# Patient Record
Sex: Male | Born: 1965 | Race: Black or African American | Hispanic: No | Marital: Single | Smoking: Never smoker
Health system: Southern US, Community
[De-identification: ages and names within clinical notes are randomized; demographics above are authoritative.]

## PROBLEM LIST (undated history)

## (undated) DIAGNOSIS — I4891 Unspecified atrial fibrillation: Secondary | ICD-10-CM

## (undated) DIAGNOSIS — I890 Lymphedema, not elsewhere classified: Secondary | ICD-10-CM

## (undated) DIAGNOSIS — R569 Unspecified convulsions: Secondary | ICD-10-CM

## (undated) DIAGNOSIS — I509 Heart failure, unspecified: Secondary | ICD-10-CM

## (undated) DIAGNOSIS — G7111 Myotonic muscular dystrophy: Secondary | ICD-10-CM

## (undated) DIAGNOSIS — E8840 Mitochondrial metabolism disorder, unspecified: Secondary | ICD-10-CM

## (undated) HISTORY — PX: OTHER SURGICAL HISTORY: SHX169

## (undated) HISTORY — PX: CRANIOTOMY: SHX93

---

## 2016-09-02 ENCOUNTER — Emergency Department (HOSPITAL_COMMUNITY): Payer: Medicare Other

## 2016-09-02 ENCOUNTER — Emergency Department (HOSPITAL_COMMUNITY)
Admission: EM | Admit: 2016-09-02 | Discharge: 2016-09-02 | Disposition: A | Discharge status: Home / Self Care | Payer: Medicare Other | Attending: Emergency Medicine | Admitting: Emergency Medicine

## 2016-09-02 ENCOUNTER — Encounter (HOSPITAL_COMMUNITY): Payer: Self-pay | Admitting: *Deleted

## 2016-09-02 DIAGNOSIS — R4182 Altered mental status, unspecified: Secondary | ICD-10-CM | POA: Diagnosis present

## 2016-09-02 DIAGNOSIS — R404 Transient alteration of awareness: Secondary | ICD-10-CM

## 2016-09-02 DIAGNOSIS — N390 Urinary tract infection, site not specified: Secondary | ICD-10-CM | POA: Diagnosis not present

## 2016-09-02 DIAGNOSIS — I5022 Chronic systolic (congestive) heart failure: Secondary | ICD-10-CM | POA: Diagnosis not present

## 2016-09-02 HISTORY — DX: Mitochondrial metabolism disorder, unspecified: E88.40

## 2016-09-02 HISTORY — DX: Myotonic muscular dystrophy: G71.11

## 2016-09-02 HISTORY — DX: Unspecified convulsions: R56.9

## 2016-09-02 HISTORY — DX: Lymphedema, not elsewhere classified: I89.0

## 2016-09-02 HISTORY — DX: Unspecified atrial fibrillation: I48.91

## 2016-09-02 HISTORY — DX: Heart failure, unspecified: I50.9

## 2016-09-02 LAB — CBG MONITORING, ED: GLUCOSE-CAPILLARY: 105 mg/dL — AB (ref 65–99)

## 2016-09-02 LAB — URINALYSIS, ROUTINE W REFLEX MICROSCOPIC
BILIRUBIN URINE: NEGATIVE
GLUCOSE, UA: NEGATIVE mg/dL
Ketones, ur: NEGATIVE mg/dL
NITRITE: NEGATIVE
PH: 5 (ref 5.0–8.0)
Protein, ur: 100 mg/dL — AB
SPECIFIC GRAVITY, URINE: 1.02 (ref 1.005–1.030)

## 2016-09-02 LAB — I-STAT ARTERIAL BLOOD GAS, ED
ACID-BASE EXCESS: 15 mmol/L — AB (ref 0.0–2.0)
Bicarbonate: 38.4 mmol/L — ABNORMAL HIGH (ref 20.0–28.0)
O2 Saturation: 100 %
PH ART: 7.599 — AB (ref 7.350–7.450)
PO2 ART: 165 mmHg — AB (ref 83.0–108.0)
TCO2: 40 mmol/L (ref 0–100)
pCO2 arterial: 38.9 mmHg (ref 32.0–48.0)

## 2016-09-02 LAB — CBC WITH DIFFERENTIAL/PLATELET
BASOS PCT: 0 %
Basophils Absolute: 0 10*3/uL (ref 0.0–0.1)
EOS PCT: 2 %
Eosinophils Absolute: 0.3 10*3/uL (ref 0.0–0.7)
HEMATOCRIT: 24.9 % — AB (ref 39.0–52.0)
HEMOGLOBIN: 7.5 g/dL — AB (ref 13.0–17.0)
Lymphocytes Relative: 16 %
Lymphs Abs: 2.4 10*3/uL (ref 0.7–4.0)
MCH: 24.2 pg — AB (ref 26.0–34.0)
MCHC: 30.1 g/dL (ref 30.0–36.0)
MCV: 80.3 fL (ref 78.0–100.0)
Monocytes Absolute: 2.7 10*3/uL — ABNORMAL HIGH (ref 0.1–1.0)
Monocytes Relative: 18 %
NEUTROS PCT: 64 %
Neutro Abs: 9.5 10*3/uL — ABNORMAL HIGH (ref 1.7–7.7)
PLATELETS: 225 10*3/uL (ref 150–400)
RBC: 3.1 MIL/uL — AB (ref 4.22–5.81)
RDW: 21.2 % — ABNORMAL HIGH (ref 11.5–15.5)
WBC: 14.9 10*3/uL — AB (ref 4.0–10.5)

## 2016-09-02 LAB — DIGOXIN LEVEL: DIGOXIN LVL: 0.3 ng/mL — AB (ref 0.8–2.0)

## 2016-09-02 LAB — BASIC METABOLIC PANEL
Anion gap: 14 (ref 5–15)
BUN: 54 mg/dL — ABNORMAL HIGH (ref 6–20)
CHLORIDE: 88 mmol/L — AB (ref 101–111)
CO2: 30 mmol/L (ref 22–32)
Calcium: 12.6 mg/dL — ABNORMAL HIGH (ref 8.9–10.3)
Creatinine, Ser: 1.03 mg/dL (ref 0.61–1.24)
GFR calc non Af Amer: >60 mL/min (ref >60)
Glucose, Bld: 99 mg/dL (ref 65–99)
Potassium: 3.1 mmol/L — ABNORMAL LOW (ref 3.5–5.1)
SODIUM: 132 mmol/L — AB (ref 135–145)

## 2016-09-02 LAB — AMMONIA: Ammonia: 43 umol/L — ABNORMAL HIGH (ref 9–35)

## 2016-09-02 LAB — TROPONIN I: Troponin I: 0.04 ng/mL (ref <0.03)

## 2016-09-02 MED ORDER — DEXTROSE 5 % IV SOLN
2.0000 g | Freq: Once | INTRAVENOUS | Status: AC
  Administered 2016-09-02: 2 g via INTRAVENOUS
  Filled 2016-09-02: qty 2

## 2016-09-02 NOTE — ED Notes (Signed)
On way to CT, pt beginning to move hands and head and open eyes.

## 2016-09-02 NOTE — Discharge Instructions (Signed)
°  The transient altered mental status, seems to be related to a urinary tract infection.  Your doctor will write prescriptions for continuing treatment following the Rocephin, which was given here today.  A digoxin level has been ordered and is pending.

## 2016-09-02 NOTE — ED Provider Notes (Signed)
[facility]EMERGENCY DEPT Provider Note   CSN: [id number] Arrival date & time: 09/02/16  1234     History   Chief Complaint No chief complaint on file.   HPI Barry [name] is a 51 y.o. male.  He is here from [facility] for evaluation of altered mental status. Exact timing is unknown. Reportedly, he is able to communicate typically by "tapping his fingers". Apparently today he could not do that, so he was sent here for evaluation. He has anoxic encephalopathy, is chronically intubated by tracheostomy, on a ventilator, with reported MRSA infection.   Level V caveat- altered mental status  HPI  Past Medical History:  Diagnosis Date  . A-fib (HCC)   . CHF (congestive heart failure) (HCC)    chronic systolic  . Chronic acquired lymphedema   . Mitochondrial disease (HCC)   . Myotonic muscular dystrophy (HCC)   . Seizures (HCC)     There are no active problems to display for this patient.   No past surgical history on file.     Home Medications    Prior to Admission medications   Not on File    Family History No family history on file.  Social History Social History  Substance Use Topics  . Smoking status: Not on file  . Smokeless tobacco: Not on file  . Alcohol use Not on file     Allergies   Chocolate; Iodine; and Nitroglycerin   Review of Systems Review of Systems  Unable to perform ROS: Mental status change     Physical Exam Updated Vital Signs BP (!) 123/52   Pulse (!) 44   Temp 97.1 F (36.2 C) (Oral)   Resp 16   SpO2 100%   Physical Exam  Constitutional: He appears well-developed. He appears toxic. He has a sickly appearance. He appears distressed.  Obese. Appears older than stated age  HENT:  Head: Normocephalic and atraumatic.  Right Ear: External ear normal.  Left Ear: External ear normal.  Eyes: Pupils are equal, round, and reactive to light.  Conjunctiva. Left eye injected without drainage. Pupils 2-3 mm bilaterally and reactive.  Disconjugate gaze, right  esotropia  Neck: Phonation normal.  Take aspirin site anteriorly appears normal.  Cardiovascular: Regular rhythm and normal heart sounds.   Bradycardic. PICC line site left upper chest wall appears normal.  Pulmonary/Chest: Breath sounds normal. No respiratory distress. He exhibits no bony tenderness.  Normal lung sounds, anterior chest wall. On ventilator.  Abdominal: Soft. He exhibits distension. He exhibits no mass. There is no tenderness. There is no guarding.  Musculoskeletal: He exhibits no deformity.  Neurological:  Flaccid arms and legs  Skin: Skin is warm, dry and intact.  Psychiatric:  Obtunded  Nursing note and vitals reviewed.    ED Treatments / Results  Labs (all labs ordered are listed, but only abnormal results are displayed) Labs Reviewed  BASIC METABOLIC PANEL - Abnormal; Notable for the following:       Result Value   Sodium 132 (*)    Potassium 3.1 (*)    Chloride 88 (*)    BUN 54 (*)    Calcium 12.6 (*)    All other components within normal limits  CBC WITH DIFFERENTIAL/PLATELET - Abnormal; Notable for the following:    WBC 14.9 (*)    RBC 3.10 (*)    Hemoglobin 7.5 (*)    HCT 24.9 (*)    MCH 24.2 (*)    RDW 21.2 (*)    Neutro Abs  9.5 (*)    Monocytes Absolute 2.7 (*)    All other components within normal limits  AMMONIA - Abnormal; Notable for the following:    Ammonia 43 (*)    All other components within normal limits  TROPONIN I - Abnormal; Notable for the following:    Troponin I 0.04 (*)    All other components within normal limits  CBG MONITORING, ED - Abnormal; Notable for the following:    Glucose-Capillary 105 (*)    All other components within normal limits  I-STAT ARTERIAL BLOOD GAS, ED - Abnormal; Notable for the following:    pH, Arterial 7.599 (*)    pO2, Arterial 165.0 (*)    Bicarbonate 38.4 (*)    Acid-Base Excess 15.0 (*)    All other components within normal limits  URINE CULTURE  BLOOD GAS,  ARTERIAL  URINALYSIS, ROUTINE W REFLEX MICROSCOPIC  DIGOXIN LEVEL    EKG  EKG Interpretation  Date/Time:  Friday September 02 2016 13:34:14 EST Ventricular Rate:  54 PR Interval:    QRS Duration: 150 QT Interval:  470 QTC Calculation: 445 R Axis:   98 Text Interpretation:  Atrial fibrillation with slow ventricular response with a competing junctional pacemaker Rightward axis Non-specific intra-ventricular conduction block Abnormal ECG No previous ECGs available Confirmed by Effie ShyWENTZ  MD, Valissa Lyvers [phone]([id number]) on 09/02/2016 2:34:26 PM       Radiology Ct Head Wo Contrast  Result Date: 09/02/2016 CLINICAL DATA:  Altered mental status today.  No recent injury. EXAM: CT HEAD WITHOUT CONTRAST TECHNIQUE: Contiguous axial images were obtained from the base of the skull through the vertex without intravenous contrast. COMPARISON:  None. FINDINGS: Brain: There is mild central and cortical atrophy. Encephalomalacia is identified within the right frontal and temporal lobes. There is no evidence for hemorrhage, mass lesion, or acute infarction. Study quality is degraded by significant patient motion artifact. Numerous cuts are repeated. Vascular: No hyperdense vessel or unexpected calcification. Skull: Status post right craniotomy.  No acute fracture identified. Sinuses/Orbits: There is significant opacity throughout the ethmoid and left sphenoid air cells. Bilateral mastoid effusions are present. Suspect remote fracture of the medial wall of the right orbit. The globes are intact. Other: None IMPRESSION: 1. Atrophy and right frontal and temporal encephalomalacia without evidence for acute intracranial abnormality. 2. Post right craniotomy changes. 3. Paranasal sinus disease. 4. Bilateral mastoid effusions. Electronically Signed   By: Norva PavlovElizabeth  Brown M.D.   On: 09/02/2016 14:40   Dg Chest Port 1 View  Result Date: 09/02/2016 CLINICAL DATA:  Altered mental status and decreased heart rate EXAM: PORTABLE CHEST 1 VIEW  COMPARISON:  None. FINDINGS: Cardiac shadow is enlarged. Central vascular congestion is noted with small bilateral pleural effusions. Tracheostomy tube is noted. Left retrocardiac density is noted likely representing atelectasis/infiltrate. A left jugular central line is noted projecting at the junction of the left subclavian and left jugular vein. No pneumothorax is noted. No bony abnormality is seen. IMPRESSION: Central vascular congestion and bilateral pleural effusions. Left retrocardiac density as described. Electronically Signed   By: Alcide CleverMark  Lukens M.D.   On: 09/02/2016 13:37    Procedures Procedures (including critical care time)  Medications Ordered in ED Medications  cefTRIAXone (ROCEPHIN) 2 g in dextrose 5 % 50 mL IVPB (not administered)     Initial Impression / Assessment and Plan / ED Course  I have reviewed the triage vital signs and the nursing notes.  Pertinent labs & imaging results that were available during my care  of the patient were reviewed by me and considered in my medical decision making (see chart for details).  Clinical Course as of Sep 02 1546  Fri Sep 02, 2016  1456 At this point, the patient has been seen to be alert and is moving both arms. I asked him to tap my hand, if he was feeling good, and he did not move his hand. I asked him to tap my hand, if he was feeling bad and he tapped my hand repetitively.  [EW]  1533 I discussed the case, with his physician at [facility], Dr. Welton Flakes, she requested that a digoxin level be sent. She states that he does not need to stay here for that result to return. She understands that the patient likely has a UTI, and will write orders for antibiotics, following his Rocephin treatment.  [EW]    Clinical Course User Index [EW] Mancel Bale, MD    Medications  cefTRIAXone (ROCEPHIN) 2 g in dextrose 5 % 50 mL IVPB (not administered)    Patient Vitals for the past 24 hrs:  BP Temp Temp src Pulse Resp SpO2  09/02/16 1500  (!) 123/52 - - (!) 44 16 100 %  09/02/16 1445 - - - (!) 51 15 100 %  09/02/16 1430 104/55 - - (!) 50 17 100 %  09/02/16 1415 - - - - 15 -  09/02/16 1400 - - - (!) 47 - 100 %  09/02/16 1345 - - - (!) 46 - 100 %  09/02/16 1330 (!) 96/45 - - (!) 40 - 100 %  09/02/16 1315 - - - (!) 39 18 100 %  09/02/16 1303 (!) 105/52 97.1 F (36.2 C) Oral (!) 44 16 100 %  09/02/16 1251 (!) 105/52 - - (!) 40 18 100 %      Final Clinical Impressions(s) / ED Diagnoses   Final diagnoses:  Urinary tract infection without hematuria, site unspecified  Transient alteration of awareness    Nonspecific altered mental status. Likely cause urinary tract infection. Bradycardia with normal blood pressure. Doubt severe sepsis, metabolic instability or impending vascular collapse.  Nursing Notes Reviewed/ Care Coordinated Applicable Imaging Reviewed Interpretation of Laboratory Data incorporated into ED treatment  The patient appears reasonably screened and/or stabilized for discharge and I doubt any other medical condition or other [facility] requiring further screening, evaluation, or treatment in the ED at this time prior to discharge.  Plan: Home Medications- continue her usual medications, and antibiotics as prescribed by the physician, at the [facility]; Home Treatments- rest; return here if the recommended treatment, does not improve the symptoms; Recommended follow up- PCP, when necessary   New Prescriptions New Prescriptions   No medications on file     Mancel Bale, MD 09/02/16 1549

## 2016-09-02 NOTE — ED Triage Notes (Signed)
Pt here via [facility] from [facility] for AMS. Staff states normally pt is able to tap to communicate, but pt is unresponsive.  Initial stats 83%, rr 16 (vented), bp118/43, cbg 154.  LBB noted by [facility] and not noted in med hx.  Pt unresponsive to painful stimuli upon arrival.

## 2016-09-02 NOTE — ED Notes (Signed)
CRITICAL VALUE ALERT  Critical value received:  0.04 Troponin   Date of notification:  09/02/2016  Time of notification: 1408   Critical value read back: Yes  Nurse who received alert:  Apolonio SchneidersHannah Steele RN  MD notified (1st page):  MD Effie ShyWentz  Time of first page:  1408   MD notified (2nd page):  Time of second page:  Responding MD:  Effie ShyWentz  Time MD responded:  [phone]

## 2016-09-03 LAB — URINE CULTURE: Culture: 60000 — AB

## 2016-09-04 ENCOUNTER — Telehealth: Payer: Self-pay

## 2016-09-04 NOTE — Telephone Encounter (Signed)
[facility] [phone]

## 2016-11-05 ENCOUNTER — Encounter (HOSPITAL_COMMUNITY): Payer: Self-pay

## 2016-11-05 ENCOUNTER — Emergency Department (HOSPITAL_COMMUNITY): Payer: Medicare Other

## 2016-11-05 ENCOUNTER — Inpatient Hospital Stay (HOSPITAL_COMMUNITY)
Admission: EM | Admit: 2016-11-05 | Discharge: 2016-11-13 | DRG: 870 | Disposition: A | Discharge status: Other Acute Inpatient Hospital | Payer: Medicare Other | Attending: Pulmonary Disease | Admitting: Pulmonary Disease

## 2016-11-05 DIAGNOSIS — I890 Lymphedema, not elsewhere classified: Secondary | ICD-10-CM | POA: Diagnosis not present

## 2016-11-05 DIAGNOSIS — Z7952 Long term (current) use of systemic steroids: Secondary | ICD-10-CM

## 2016-11-05 DIAGNOSIS — Z6841 Body Mass Index (BMI) 40.0 and over, adult: Secondary | ICD-10-CM | POA: Diagnosis not present

## 2016-11-05 DIAGNOSIS — J9601 Acute respiratory failure with hypoxia: Secondary | ICD-10-CM

## 2016-11-05 DIAGNOSIS — J9600 Acute respiratory failure, unspecified whether with hypoxia or hypercapnia: Secondary | ICD-10-CM | POA: Diagnosis present

## 2016-11-05 DIAGNOSIS — A419 Sepsis, unspecified organism: Secondary | ICD-10-CM | POA: Diagnosis present

## 2016-11-05 DIAGNOSIS — L8990 Pressure ulcer of unspecified site, unspecified stage: Secondary | ICD-10-CM | POA: Insufficient documentation

## 2016-11-05 DIAGNOSIS — Z930 Tracheostomy status: Secondary | ICD-10-CM

## 2016-11-05 DIAGNOSIS — Z7982 Long term (current) use of aspirin: Secondary | ICD-10-CM

## 2016-11-05 DIAGNOSIS — I959 Hypotension, unspecified: Secondary | ICD-10-CM

## 2016-11-05 DIAGNOSIS — L89152 Pressure ulcer of sacral region, stage 2: Secondary | ICD-10-CM | POA: Diagnosis not present

## 2016-11-05 DIAGNOSIS — J9611 Chronic respiratory failure with hypoxia: Secondary | ICD-10-CM

## 2016-11-05 DIAGNOSIS — K5641 Fecal impaction: Secondary | ICD-10-CM | POA: Diagnosis present

## 2016-11-05 DIAGNOSIS — E1165 Type 2 diabetes mellitus with hyperglycemia: Secondary | ICD-10-CM | POA: Diagnosis present

## 2016-11-05 DIAGNOSIS — Z9911 Dependence on respirator [ventilator] status: Secondary | ICD-10-CM

## 2016-11-05 DIAGNOSIS — K921 Melena: Secondary | ICD-10-CM | POA: Diagnosis present

## 2016-11-05 DIAGNOSIS — Z79899 Other long term (current) drug therapy: Secondary | ICD-10-CM

## 2016-11-05 DIAGNOSIS — E11649 Type 2 diabetes mellitus with hypoglycemia without coma: Secondary | ICD-10-CM | POA: Diagnosis present

## 2016-11-05 DIAGNOSIS — G40909 Epilepsy, unspecified, not intractable, without status epilepticus: Secondary | ICD-10-CM | POA: Diagnosis present

## 2016-11-05 DIAGNOSIS — K922 Gastrointestinal hemorrhage, unspecified: Secondary | ICD-10-CM

## 2016-11-05 DIAGNOSIS — Z91018 Allergy to other foods: Secondary | ICD-10-CM

## 2016-11-05 DIAGNOSIS — E876 Hypokalemia: Secondary | ICD-10-CM | POA: Diagnosis present

## 2016-11-05 DIAGNOSIS — J9622 Acute and chronic respiratory failure with hypercapnia: Secondary | ICD-10-CM | POA: Diagnosis not present

## 2016-11-05 DIAGNOSIS — J151 Pneumonia due to Pseudomonas: Secondary | ICD-10-CM | POA: Diagnosis present

## 2016-11-05 DIAGNOSIS — J9621 Acute and chronic respiratory failure with hypoxia: Secondary | ICD-10-CM | POA: Diagnosis present

## 2016-11-05 DIAGNOSIS — I110 Hypertensive heart disease with heart failure: Secondary | ICD-10-CM | POA: Diagnosis not present

## 2016-11-05 DIAGNOSIS — G7111 Myotonic muscular dystrophy: Secondary | ICD-10-CM | POA: Diagnosis present

## 2016-11-05 DIAGNOSIS — K920 Hematemesis: Secondary | ICD-10-CM | POA: Diagnosis present

## 2016-11-05 DIAGNOSIS — Z515 Encounter for palliative care: Secondary | ICD-10-CM

## 2016-11-05 DIAGNOSIS — Z452 Encounter for adjustment and management of vascular access device: Secondary | ICD-10-CM

## 2016-11-05 DIAGNOSIS — I5043 Acute on chronic combined systolic (congestive) and diastolic (congestive) heart failure: Secondary | ICD-10-CM | POA: Diagnosis not present

## 2016-11-05 DIAGNOSIS — I482 Chronic atrial fibrillation: Secondary | ICD-10-CM | POA: Diagnosis present

## 2016-11-05 DIAGNOSIS — R627 Adult failure to thrive: Secondary | ICD-10-CM

## 2016-11-05 DIAGNOSIS — I429 Cardiomyopathy, unspecified: Secondary | ICD-10-CM | POA: Diagnosis present

## 2016-11-05 DIAGNOSIS — D62 Acute posthemorrhagic anemia: Secondary | ICD-10-CM | POA: Diagnosis not present

## 2016-11-05 DIAGNOSIS — J9690 Respiratory failure, unspecified, unspecified whether with hypoxia or hypercapnia: Secondary | ICD-10-CM

## 2016-11-05 DIAGNOSIS — G9341 Metabolic encephalopathy: Secondary | ICD-10-CM | POA: Diagnosis not present

## 2016-11-05 DIAGNOSIS — J9602 Acute respiratory failure with hypercapnia: Secondary | ICD-10-CM | POA: Diagnosis not present

## 2016-11-05 DIAGNOSIS — Z888 Allergy status to other drugs, medicaments and biological substances status: Secondary | ICD-10-CM

## 2016-11-05 DIAGNOSIS — Z8249 Family history of ischemic heart disease and other diseases of the circulatory system: Secondary | ICD-10-CM

## 2016-11-05 DIAGNOSIS — Z4659 Encounter for fitting and adjustment of other gastrointestinal appliance and device: Secondary | ICD-10-CM

## 2016-11-05 DIAGNOSIS — Z931 Gastrostomy status: Secondary | ICD-10-CM

## 2016-11-05 DIAGNOSIS — R0602 Shortness of breath: Secondary | ICD-10-CM

## 2016-11-05 DIAGNOSIS — Y95 Nosocomial condition: Secondary | ICD-10-CM | POA: Diagnosis present

## 2016-11-05 DIAGNOSIS — R6521 Severe sepsis with septic shock: Secondary | ICD-10-CM | POA: Diagnosis not present

## 2016-11-05 DIAGNOSIS — R001 Bradycardia, unspecified: Secondary | ICD-10-CM | POA: Diagnosis present

## 2016-11-05 DIAGNOSIS — N390 Urinary tract infection, site not specified: Secondary | ICD-10-CM | POA: Diagnosis present

## 2016-11-05 DIAGNOSIS — Z7189 Other specified counseling: Secondary | ICD-10-CM

## 2016-11-05 DIAGNOSIS — R680 Hypothermia, not associated with low environmental temperature: Secondary | ICD-10-CM | POA: Diagnosis present

## 2016-11-05 LAB — CBC WITH DIFFERENTIAL/PLATELET
BASOS ABS: 0 10*3/uL (ref 0.0–0.1)
BASOS PCT: 0 %
Eosinophils Absolute: 0.3 10*3/uL (ref 0.0–0.7)
Eosinophils Relative: 2 %
HEMATOCRIT: 20.4 % — AB (ref 39.0–52.0)
HEMOGLOBIN: 6.3 g/dL — AB (ref 13.0–17.0)
LYMPHS ABS: 2.8 10*3/uL (ref 0.7–4.0)
Lymphocytes Relative: 18 %
MCH: 26.1 pg (ref 26.0–34.0)
MCHC: 30.9 g/dL (ref 30.0–36.0)
MCV: 84.6 fL (ref 78.0–100.0)
MONOS PCT: 5 %
Monocytes Absolute: 0.8 10*3/uL (ref 0.1–1.0)
NEUTROS ABS: 11.9 10*3/uL — AB (ref 1.7–7.7)
Neutrophils Relative %: 75 %
Platelets: DECREASED 10*3/uL (ref 150–400)
RBC: 2.41 MIL/uL — ABNORMAL LOW (ref 4.22–5.81)
RDW: 22.4 % — ABNORMAL HIGH (ref 11.5–15.5)
SMEAR REVIEW: DECREASED
WBC: 15.8 10*3/uL — ABNORMAL HIGH (ref 4.0–10.5)

## 2016-11-05 LAB — I-STAT ARTERIAL BLOOD GAS, ED
Acid-base deficit: 6 mmol/L — ABNORMAL HIGH (ref 0.0–2.0)
BICARBONATE: 20 mmol/L (ref 20.0–28.0)
O2 SAT: 99 %
PCO2 ART: 44.2 mmHg (ref 32.0–48.0)
TCO2: 21 mmol/L (ref 0–100)
pH, Arterial: 7.264 — ABNORMAL LOW (ref 7.350–7.450)
pO2, Arterial: 146 mmHg — ABNORMAL HIGH (ref 83.0–108.0)

## 2016-11-05 LAB — COMPREHENSIVE METABOLIC PANEL
ALBUMIN: 1.8 g/dL — AB (ref 3.5–5.0)
ALK PHOS: 142 U/L — AB (ref 38–126)
ALT: 23 U/L (ref 17–63)
AST: 26 U/L (ref 15–41)
Anion gap: 7 (ref 5–15)
BILIRUBIN TOTAL: 1 mg/dL (ref 0.3–1.2)
BUN: 28 mg/dL — AB (ref 6–20)
CO2: 18 mmol/L — AB (ref 22–32)
Calcium: 7.8 mg/dL — ABNORMAL LOW (ref 8.9–10.3)
Chloride: 110 mmol/L (ref 101–111)
Creatinine, Ser: 0.63 mg/dL (ref 0.61–1.24)
GFR calc Af Amer: >60 mL/min (ref >60)
GFR calc non Af Amer: >60 mL/min (ref >60)
GLUCOSE: 91 mg/dL (ref 65–99)
POTASSIUM: 5.4 mmol/L — AB (ref 3.5–5.1)
SODIUM: 135 mmol/L (ref 135–145)
TOTAL PROTEIN: 6.4 g/dL — AB (ref 6.5–8.1)

## 2016-11-05 LAB — URINALYSIS, ROUTINE W REFLEX MICROSCOPIC
BILIRUBIN URINE: NEGATIVE
GLUCOSE, UA: NEGATIVE mg/dL
KETONES UR: 5 mg/dL — AB
Nitrite: NEGATIVE
PH: 5 (ref 5.0–8.0)
Protein, ur: 100 mg/dL — AB
SQUAMOUS EPITHELIAL / LPF: NONE SEEN
Specific Gravity, Urine: 1.018 (ref 1.005–1.030)

## 2016-11-05 LAB — POCT I-STAT 3, ART BLOOD GAS (G3+)
Acid-base deficit: 8 mmol/L — ABNORMAL HIGH (ref 0.0–2.0)
BICARBONATE: 19.6 mmol/L — AB (ref 20.0–28.0)
O2 Saturation: 35 %
PO2 ART: 25 mmHg — AB (ref 83.0–108.0)
Patient temperature: 98.6
TCO2: 21 mmol/L (ref 0–100)
pCO2 arterial: 47.5 mmHg (ref 32.0–48.0)
pH, Arterial: 7.224 — ABNORMAL LOW (ref 7.350–7.450)

## 2016-11-05 LAB — PROTIME-INR
INR: 1.16
Prothrombin Time: 14.9 seconds (ref 11.4–15.2)

## 2016-11-05 LAB — GLUCOSE, CAPILLARY: Glucose-Capillary: 62 mg/dL — ABNORMAL LOW (ref 65–99)

## 2016-11-05 LAB — BRAIN NATRIURETIC PEPTIDE: B NATRIURETIC PEPTIDE 5: 176.2 pg/mL — AB (ref 0.0–100.0)

## 2016-11-05 LAB — ABO/RH: ABO/RH(D): A POS

## 2016-11-05 LAB — POC OCCULT BLOOD, ED: FECAL OCCULT BLD: POSITIVE — AB

## 2016-11-05 LAB — I-STAT CG4 LACTIC ACID, ED
LACTIC ACID, VENOUS: 0.69 mmol/L (ref 0.5–1.9)
Lactic Acid, Venous: 0.76 mmol/L (ref 0.5–1.9)

## 2016-11-05 LAB — DIGOXIN LEVEL: Digoxin Level: 0.8 ng/mL (ref 0.8–2.0)

## 2016-11-05 LAB — PREPARE RBC (CROSSMATCH)

## 2016-11-05 MED ORDER — SODIUM CHLORIDE 0.9 % IV SOLN
Freq: Once | INTRAVENOUS | Status: AC
  Administered 2016-11-05: 10 mL/h via INTRAVENOUS

## 2016-11-05 MED ORDER — IPRATROPIUM-ALBUTEROL 0.5-2.5 (3) MG/3ML IN SOLN
3.0000 mL | Freq: Four times a day (QID) | RESPIRATORY_TRACT | Status: DC
  Administered 2016-11-05 – 2016-11-07 (×7): 3 mL via RESPIRATORY_TRACT
  Filled 2016-11-05 (×7): qty 3

## 2016-11-05 MED ORDER — FLUOXETINE HCL 20 MG PO CAPS
20.0000 mg | ORAL_CAPSULE | Freq: Every day | ORAL | Status: DC
  Administered 2016-11-06 – 2016-11-13 (×9): 20 mg
  Filled 2016-11-05 (×9): qty 1

## 2016-11-05 MED ORDER — FLUTICASONE PROPIONATE 50 MCG/ACT NA SUSP
1.0000 | Freq: Every day | NASAL | Status: DC
  Administered 2016-11-06 – 2016-11-13 (×8): 1 via NASAL
  Filled 2016-11-05 (×2): qty 16

## 2016-11-05 MED ORDER — VANCOMYCIN HCL IN DEXTROSE 1-5 GM/200ML-% IV SOLN: 1000.0000 mg | Freq: Once | INTRAVENOUS | Status: DC

## 2016-11-05 MED ORDER — MIDODRINE HCL 5 MG PO TABS
10.0000 mg | ORAL_TABLET | Freq: Four times a day (QID) | ORAL | Status: DC
  Administered 2016-11-05 – 2016-11-13 (×32): 10 mg
  Filled 2016-11-05 (×32): qty 2

## 2016-11-05 MED ORDER — LORATADINE 10 MG PO TABS
10.0000 mg | ORAL_TABLET | Freq: Every day | ORAL | Status: DC
  Administered 2016-11-06 – 2016-11-13 (×8): 10 mg
  Filled 2016-11-05 (×8): qty 1

## 2016-11-05 MED ORDER — FAMOTIDINE IN NACL 20-0.9 MG/50ML-% IV SOLN
20.0000 mg | Freq: Two times a day (BID) | INTRAVENOUS | Status: DC
  Administered 2016-11-06 – 2016-11-13 (×16): 20 mg via INTRAVENOUS
  Filled 2016-11-05 (×16): qty 50

## 2016-11-05 MED ORDER — PIPERACILLIN-TAZOBACTAM 3.375 G IVPB 30 MIN
3.3750 g | Freq: Once | INTRAVENOUS | Status: AC
  Administered 2016-11-05: 3.375 g via INTRAVENOUS
  Filled 2016-11-05: qty 50

## 2016-11-05 MED ORDER — SODIUM CHLORIDE 0.9 % IV BOLUS (SEPSIS)
1000.0000 mL | Freq: Once | INTRAVENOUS | Status: AC
  Administered 2016-11-05: 1000 mL via INTRAVENOUS

## 2016-11-05 MED ORDER — FENTANYL CITRATE (PF) 100 MCG/2ML IJ SOLN
100.0000 ug | INTRAMUSCULAR | Status: DC | PRN
  Administered 2016-11-06 – 2016-11-13 (×11): 100 ug via INTRAVENOUS
  Administered 2016-11-13: 25 ug via INTRAVENOUS
  Filled 2016-11-05 (×10): qty 2

## 2016-11-05 MED ORDER — SODIUM BICARBONATE 8.4 % IV SOLN
INTRAVENOUS | Status: AC
  Administered 2016-11-05: 100 meq via INTRAVENOUS
  Filled 2016-11-05: qty 100

## 2016-11-05 MED ORDER — SODIUM CHLORIDE 0.9 % IV SOLN
INTRAVENOUS | Status: DC
  Administered 2016-11-06 – 2016-11-07 (×2): via INTRAVENOUS

## 2016-11-05 MED ORDER — FENTANYL CITRATE (PF) 100 MCG/2ML IJ SOLN
100.0000 ug | INTRAMUSCULAR | Status: DC | PRN
  Filled 2016-11-05 (×2): qty 2

## 2016-11-05 MED ORDER — MIDAZOLAM HCL 2 MG/2ML IJ SOLN: 2.0000 mg | INTRAMUSCULAR | Status: DC | PRN

## 2016-11-05 MED ORDER — MIDAZOLAM HCL 2 MG/2ML IJ SOLN
2.0000 mg | INTRAMUSCULAR | Status: DC | PRN
  Administered 2016-11-07 (×2): 2 mg via INTRAVENOUS
  Filled 2016-11-05 (×2): qty 2

## 2016-11-05 MED ORDER — LACTULOSE 10 GM/15ML PO SOLN
20.0000 g | Freq: Two times a day (BID) | ORAL | Status: DC
  Administered 2016-11-06: 20 g via ORAL
  Filled 2016-11-05 (×3): qty 30

## 2016-11-05 MED ORDER — DOCUSATE SODIUM 50 MG/5ML PO LIQD: 100.0000 mg | Freq: Two times a day (BID) | ORAL | Status: DC | PRN

## 2016-11-05 MED ORDER — DOPAMINE-DEXTROSE 3.2-5 MG/ML-% IV SOLN
0.0000 ug/kg/min | INTRAVENOUS | Status: DC
  Administered 2016-11-05: 5 ug/kg/min via INTRAVENOUS
  Administered 2016-11-06: 10 ug/kg/min via INTRAVENOUS
  Filled 2016-11-05 (×2): qty 250

## 2016-11-05 MED ORDER — DEXTROSE 5 % IV SOLN
INTRAVENOUS | Status: DC
  Administered 2016-11-05: 23:00:00 via INTRAVENOUS

## 2016-11-05 MED ORDER — BUDESONIDE 0.5 MG/2ML IN SUSP
0.5000 mg | Freq: Two times a day (BID) | RESPIRATORY_TRACT | Status: DC
  Administered 2016-11-05 – 2016-11-13 (×16): 0.5 mg via RESPIRATORY_TRACT
  Filled 2016-11-05 (×16): qty 2

## 2016-11-05 MED ORDER — PIPERACILLIN-TAZOBACTAM 3.375 G IVPB
3.3750 g | Freq: Three times a day (TID) | INTRAVENOUS | Status: DC
  Administered 2016-11-06 – 2016-11-09 (×11): 3.375 g via INTRAVENOUS
  Filled 2016-11-05 (×11): qty 50

## 2016-11-05 MED ORDER — SODIUM CHLORIDE 0.9 % IV SOLN: 250.0000 mL | INTRAVENOUS | Status: DC | PRN

## 2016-11-05 MED ORDER — ATROPINE SULFATE 1 MG/10ML IJ SOSY
PREFILLED_SYRINGE | INTRAMUSCULAR | Status: AC
  Filled 2016-11-05: qty 10

## 2016-11-05 MED ORDER — SODIUM BICARBONATE 8.4 % IV SOLN
100.0000 meq | Freq: Once | INTRAVENOUS | Status: AC
  Administered 2016-11-05: 100 meq via INTRAVENOUS

## 2016-11-05 MED ORDER — VANCOMYCIN HCL IN DEXTROSE 1-5 GM/200ML-% IV SOLN
1000.0000 mg | Freq: Three times a day (TID) | INTRAVENOUS | Status: DC
  Administered 2016-11-05: 1000 mg via INTRAVENOUS
  Filled 2016-11-05: qty 200

## 2016-11-05 MED ORDER — VANCOMYCIN HCL IN DEXTROSE 1-5 GM/200ML-% IV SOLN
1000.0000 mg | INTRAVENOUS | Status: DC
  Administered 2016-11-06 – 2016-11-08 (×3): 1000 mg via INTRAVENOUS
  Filled 2016-11-05 (×4): qty 200

## 2016-11-05 NOTE — ED Notes (Signed)
IVT at bedside attempting access

## 2016-11-05 NOTE — ED Notes (Signed)
ER resident at bedside placed second IV 18 G fluids hung to this line wide open

## 2016-11-05 NOTE — Progress Notes (Addendum)
Pharmacy Antibiotic Note  Barry [name] is a 52 y.o. male admitted on 11/05/2016 with sepsis.  Pharmacy has been consulted for vancomycin and zosyn dosing. Patient presents from [facility] and was on ceftriaxone for UTI. Noted that patient has history of muscular dystrophy and had been therapeutic on vancomycin  q24h. Will increase dose slightly secondary to new sepsis.  Plan: Vancomycin 1g IV every 24 hours.  Goal trough 15-20 mcg/mL. Zosyn 3.375g IV q8h (4 hour infusion).  Monitor culture data, renal function and clinical course VT at SS prn  Height:  (170.2 cm) Weight: 200 lb (90.7 kg) IBW/kg (Calculated) : 66.1  Temp (24hrs), Avg:98.1 F (36.7 C), Min:98.1 F (36.7 C), Max:98.1 F (36.7 C)   Recent Labs Lab 11/05/16 1004 11/05/16 1011 11/05/16 1141  WBC 15.8*  --   --   CREATININE 0.63  --   --   LATICACIDVEN  --  0.76 0.69    Estimated Creatinine Clearance: 118.6 mL/min (by C-G formula based on SCr of 0.63 mg/dL).    Allergies  Allergen Reactions  . Chocolate   . Iodine   . Nitroglycerin      Arlean Hopping. Newman Pies, PharmD, BCPS Clinical Pharmacist [phone] 11/05/2016 10:12 AM

## 2016-11-05 NOTE — ED Notes (Signed)
RT at bedside pt remains on monitor care handoff complete

## 2016-11-05 NOTE — ED Notes (Signed)
Crit care at bedside

## 2016-11-05 NOTE — Progress Notes (Signed)
ABG ordered for patient.  ABG obtained on tidal volume of 550, respiratory rate of 26, FIO2 of 40%, and PEEP of 5.  Increased respiratory rate to 30.  Will continue to monitor.    Ref. Range 11/05/2016 09:55  Sample type Unknown ARTERIAL  pH, Arterial Latest Ref Range: 7.350 - 7.450  7.264 (L)  pCO2 arterial Latest Ref Range: 32.0 - 48.0 mmHg 44.2  pO2, Arterial Latest Ref Range: 83.0 - 108.0 mmHg 146.0 (H)  TCO2 Latest Ref Range: 0 - 100 mmol/L 21  Acid-base deficit Latest Ref Range: 0.0 - 2.0 mmol/L 6.0 (H)  Bicarbonate Latest Ref Range: 20.0 - 28.0 mmol/L 20.0  O2 Saturation Latest Units: % 99.0  Patient temperature Unknown 98.6 F  Collection site Unknown RADIAL, ALLEN'S T.Marland KitchenMarland Kitchen

## 2016-11-05 NOTE — ED Notes (Signed)
Blood bank called pt's blood type will take a while to result

## 2016-11-05 NOTE — H&P (Signed)
PULMONARY / CRITICAL CARE MEDICINE   Name: Barry [name] MRN: [id number] DOB: [birth year]    ADMISSION DATE:  11/05/2016 CONSULTATION DATE:  11/05/2016  REFERRING MD:  Dr. Rosalia Hammers  CHIEF COMPLAINT:  hypotension  HISTORY OF PRESENT ILLNESS:   Barry [name] is a 51yrs Male with history of HTN, mitochondrial disease causing myopathy, recently vent dependent at [facility], sCHF, a-fib (on dig), seizure disorder. He was admitted in September 2017 for respiratory failure at an outside facility. During that admission, he ended up being vent dependent and had a tracheostomy.  Unfortunately, I am unable to obtain history from the patient as he is encephalopathic so chart review was done. Patient's blood pressure chronically  runs low for which he is on midodrine  Patient was sent from [facility] facility because of relative hypotension, questionable bloody stools. At the ER, blood pressure was soft. Hemoglobin was 6 from a baseline of 7.5. There was concern for GI bleed, possible sepsis so PCCM was asked to admit. He was a difficult IV access for which ED physician has tried several times and after several hours, was able to get 2 peripheral IVs on him. He ended up getting 4 L of saline and blood pressure remained 90/60, heart rate in the 60s.   PAST MEDICAL HISTORY :  He  has a past medical history of A-fib [facility]); CHF (congestive heart failure) (HCC); Chronic acquired lymphedema; Mitochondrial disease (HCC); Myotonic muscular dystrophy (HCC); and Seizures (HCC).  PAST SURGICAL HISTORY: He  has no past surgical history on file.  Status post craniotomy 03/2013.    Allergies  Allergen Reactions  . Chocolate     ON Mar- no reaction   . Iodine   . Nitroglycerin     No current facility-administered medications on file prior to encounter.    No current outpatient prescriptions on file prior to encounter.    FAMILY HISTORY:  His has no family status information on file.  No information  available.  SOCIAL HISTORY: He  reports that he has never smoked. He has never used smokeless tobacco.  REVIEW OF SYSTEMS:   Unable to obtain  SUBJECTIVE:  As above  VITAL SIGNS: BP 103/88   Pulse 69   Temp (!) 93.9 F (34.4 C) (Rectal)   Resp (!) 24   Ht  (1.702 m)   Wt 90.7 kg (200 lb)   SpO2 100%   BMI 31.32 kg/m   HEMODYNAMICS:    VENTILATOR SETTINGS: Vent Mode: PRVC FiO2 (%):  [40 %] 40 % Set Rate:  [26 bmp-30 bmp] 30 bmp Vt Set:  [550 mL] 550 mL PEEP:  [5 cmH20] 5 cmH20 Plateau Pressure:  [31 cmH20-47 cmH20] 47 cmH20  INTAKE / OUTPUT: I/O last 3 completed shifts: In: 4050 [IV Piggyback:4050] Out: -   PHYSICAL EXAMINATION: General:  Sedated, comfortable, not in distress. Occasional eye opening. Not following commands. Neuro:  CN grossly intact.  (-) lateralizing signs HEENT:  (-) NVD. Cushingoid fascies. Trache in place.  Cardiovascular:  Good s1/s2.  (-) s3/m.r/g Lungs:  Good ae, some crackles at bases.  Abdomen:  (+) BS, soft, obese, distended. (+) PRG tube in place Musculoskeletal:  Edema up to thighs.  Skin:  Warm and dry. (+) skin breakdown  LABS:  BMET  Recent Labs Lab 11/05/16 1004  NA 135  K 5.4*  CL 110  CO2 18*  BUN 28*  CREATININE 0.63  GLUCOSE 91    Electrolytes  Recent Labs Lab 11/05/16 1004  CALCIUM 7.8*    CBC  Recent Labs Lab 11/05/16 1004  WBC 15.8*  HGB 6.3*  HCT 20.4*  PLT PLATELETS APPEAR DECREASED    Coag's  Recent Labs Lab 11/05/16 1120  INR 1.16    Sepsis Markers  Recent Labs Lab 11/05/16 1011 11/05/16 1141  LATICACIDVEN 0.76 0.69    ABG  Recent Labs Lab 11/05/16 0955  PHART 7.264*  PCO2ART 44.2  PO2ART 146.0*    Liver Enzymes  Recent Labs Lab 11/05/16 1004  AST 26  ALT 23  ALKPHOS 142*  BILITOT 1.0  ALBUMIN 1.8*    Cardiac Enzymes No results for input(s): TROPONINI, PROBNP in the last 168 hours.  Glucose No results for input(s): GLUCAP in the last 168  hours.  Imaging Dg Chest Port 1 View  Addendum Date: 11/05/2016   ADDENDUM REPORT: 11/05/2016 10:35 ADDENDUM: No PICC line is identified. Electronically Signed   By: Gerome Sam III M.D   On: 11/05/2016 10:35   Result Date: 11/05/2016 CLINICAL DATA:  Hypotension EXAM: PORTABLE CHEST 1 VIEW COMPARISON:  September 02, 2016 FINDINGS: The tracheostomy tube remains in good position. No pneumothorax. The cardiomediastinal silhouette is stable. Bilateral pulmonary opacities remain. More focal opacity in the left retrocardiac region is suggested and stable to mildly improved. No other acute abnormalities identified. IMPRESSION: 1. Bilateral pulmonary opacities. The findings could represent edema or infection. More focal opacity in the left base. Recommend clinical correlation and follow-up to resolution. Electronically Signed: By: Gerome Sam III M.D On: 11/05/2016 09:26   Dg Humerus Left  Result Date: 11/05/2016 CLINICAL DATA:  PICC line placement. EXAM: LEFT HUMERUS - 1 VIEW COMPARISON:  None. FINDINGS: Single portable view shows a short PICC line which is looped back on itself in the mid upper arm, medial to the humerus. IMPRESSION: Short PICC line is looped in mid upper arm soft tissues, medial to the humerus. Electronically Signed   By: Myles Rosenthal M.D.   On: 11/05/2016 12:15     STUDIES:    CULTURES: Sputum 4/7 > MRSA 4/7 > Blood 4/7 >   ANTIBIOTICS: Zosyn 4/7 > Vanc 4/7 >   SIGNIFICANT EVENTS:   LINES/TUBES: Trache 04/2016 > OSH L PICC > OSH  DISCUSSION: Patient is a 51 year old male, chronic ventilator dependent, related to mitochondrial disease, admitted from [facility] facility because of hypotension, bloody stools. There is also concern for sepsis/occult infection.  ASSESSMENT / PLAN:  PULMONARY A: Acute on chronic hypoxemic hypercapnic respiratory failure secondary to likely pulmonary edema, concern for HCAP Patient is on chronic ventilator because of muscular weakness  related to mitochondrial disease. P:   Continue ventilatory support. No weaning. He is not requiring more Fio2.  Broad-spectrum antibiotics. Panculture. Neb meds We'll need diuresis once blood pressures improved.   CARDIOVASCULAR A:  Chronic hypotension on midodrine.  Concern for lower blood pressure than baseline likely related to sepsis, possible GI bleed CHF Chronic afib P:  Status post 4 L saline. He is edematous up to thighs.  BP 90/60 now which is probably his baseline.  Transfuse blood to keep Hb > 7.  Holding off on heparin, BP meds Restart midodrine If BP becomes lower, will start pressors   RENAL A:   Volume overload P:   S/P 4L NS at ED.  Will need diuresis once BP is better.    GASTROINTESTINAL A:   UGIB likely from PUD P:   Holding off on OAC/heparin Transfuse to keep Hb > 7 May need GI  evaln if not better.  Keep NPO   HEMATOLOGIC A:   Anemia 2/2 UGIBleed P:  Transfuse accordingly, keep Hb > 7   INFECTIOUS A:   R/O HCAP, UTI P:   Panculture Vanc + Zosyn Trend lactate   ENDOCRINE A:   No issues P:   Check CBG q4 + SSI   NEUROLOGIC A:   Encephalopathy related to sepsis + chronic deconditioning/medical illness P:   RASS goal: 0 - -1 Versed and fentanyl when necessary pushes.   FAMILY  - Updates: No family at bedside.  - Inter-disciplinary family meet or Palliative Care meeting due by:  11/12/16     I spent  30  minutes of Critical Care time with this patient today.  Once blood pressure issues are better and anemia is better, we'll transfer patient to chronic ventilator unit/4E.    Pollie Meyer, MD Pulmonary and Critical Care Medicine [facility] Pager: [phone] After 3 pm or if no response, call [phone]  11/05/2016, 7:52 PM

## 2016-11-05 NOTE — ED Triage Notes (Signed)
Pt arrives via [facility] for evaluation of hypotension pt has a history of intermittent hypotension ,reported by [facility] that his BP normalized en route pt is a chronic vent pt with a triple lumen PICC and Foley in place from facility

## 2016-11-05 NOTE — ED Notes (Signed)
Spoke with Radiology regarding CXR no PICC identification on CXR radiologist to talk with reading room will call back cannot infuse fluids until PICC is verified

## 2016-11-05 NOTE — ED Notes (Signed)
Critcal Care MD aware that pt has not had blood transfusion given at this time due to having many antibodies as per blood bank pt remains on monitor no change in condition

## 2016-11-05 NOTE — ED Notes (Signed)
Critical Care MD at bedside will be placing orders on pt.

## 2016-11-05 NOTE — Progress Notes (Signed)
Assessed PICC line per request. Not visualized on XRay per radiologist or self.  All ports flush - whitye port sluggish, but the others have GBR.  RN states to get an arm XR to try to visualize line.  Noted possibility to be a midline rather than PICC line.

## 2016-11-05 NOTE — ED Notes (Signed)
Unable to bring pt. Up to the floor due at this time due to RT availability accepting RN made aware blood now ill be started in ED while pt. Waits for transport

## 2016-11-05 NOTE — ED Notes (Signed)
Several attempts made for IV via [place] unsuccessful ER MD to bedside for Central line placement

## 2016-11-05 NOTE — ED Notes (Signed)
Pt. explained risks and benefits of blood transfusion by ER MD pt gave consent for blood transfusion due to pt's inability to sign consent witnessed by two RN's and copy placed in chart

## 2016-11-05 NOTE — ED Notes (Signed)
Charge RN spoke with Radiology unable to visualize PICC line on films This RN attempted two sticks for IV that were unsuccessful, second RN at bedside for IV attempt IVT consult placed STAT for access unable to utilize PICC at this time pt remains on monitor no change in condition

## 2016-11-05 NOTE — Progress Notes (Addendum)
[facility] Physician-Brief Progress Note Patient Name: Barry [name] DOB: [birth year] MRN: [id number]   Date of Service  11/05/2016  HPI/Events of Note  Hypotension and Bradycardia - BP = 74/42 and HR = 57.   eICU Interventions  Will order: 1. Dopamine IV infusion. Titrate to MAP >= 65. 2. CBC and ABG now.     Intervention Category Major Interventions: Hypotension - evaluation and management  Sommer,Steven Eugene 11/05/2016, 9:54 PM

## 2016-11-05 NOTE — ED Notes (Signed)
Spoke with X-ray they are on their way pt remains on monitor no change in condition, IVT unsuccessful at IV placement

## 2016-11-05 NOTE — ED Provider Notes (Addendum)
[facility]-EMERGENCY DEPT Provider Note   CSN: [id number] Arrival date & time: 11/05/16  0840     History   Chief Complaint Chief Complaint  Patient presents with  . Hypotension    Pt sent in from [facility] for hypotension   Level V caveat secondary tracheostomy  HPI Barry [name] is a 51 y.o. male.  HPI 51 y.o. Male History of muscular dystrophy chronically on vent from [facility] presents with reports that he has been hypotensive.  EMS reports that when they arrived they were told his blood pressure had been low during the night. They noted that he was normotensive on their initial evaluation. Patient has a triple-lumen PICC line and Foley catheter in place. Patient is able to shake his head yes and no unable to give clear history. Past Medical History:  Diagnosis Date  . A-fib (HCC)   . CHF (congestive heart failure) (HCC)    chronic systolic  . Chronic acquired lymphedema   . Mitochondrial disease (HCC)   . Myotonic muscular dystrophy (HCC)   . Seizures (HCC)     There are no active problems to display for this patient.   History reviewed. No pertinent surgical history.     Home Medications    Prior to Admission medications   Medication Sig Start Date End Date Taking? Authorizing Provider  aspirin 81 MG chewable tablet Place 81 mg into feeding tube daily.   Yes Historical Provider, MD  bumetanide (BUMEX) 2 MG tablet Place 2 mg into feeding tube daily.   Yes Historical Provider, MD  calcium carbonate (TUMS - DOSED IN MG ELEMENTAL CALCIUM) 500 MG chewable tablet Chew 500 tablets by mouth daily.   Yes Historical Provider, MD  cefTRIAXone (ROCEPHIN) IVPB Inject 1 g into the vein daily. Started on 11-04-16. *stop date will be 11-07-16 ( per [facility])   Yes Historical Provider, MD  chlorhexidine (PERIDEX) 0.12 % solution Use as directed 15 mLs in the mouth or throat 2 (two) times daily.   Yes Historical Provider, MD  digoxin (LANOXIN) 0.125 MG tablet Place 0.125 mg into feeding  tube daily.   Yes Historical Provider, MD  docusate sodium (COLACE) 100 MG capsule 100 mg 2 (two) times daily.   Yes Historical Provider, MD  FLUoxetine (PROZAC) 20 MG tablet Place 20 mg into feeding tube daily.   Yes Historical Provider, MD  fluticasone (FLONASE) 50 MCG/ACT nasal spray Place 1 spray into both nostrils daily.   Yes Historical Provider, MD  insulin detemir (LEVEMIR) 100 UNIT/ML injection Inject 20 Units into the skin at bedtime.   Yes Historical Provider, MD  Lactulose 20 GM/30ML SOLN Take 30 mLs by mouth every 12 (twelve) hours.   Yes Historical Provider, MD  loratadine (CLARITIN) 10 MG tablet Place 10 mg into feeding tube daily.   Yes Historical Provider, MD  midodrine (PROAMATINE) 10 MG tablet Place 10 mg into feeding tube every 6 (six) hours.   Yes Historical Provider, MD  Multiple Vitamins-Minerals (MULTIVITAMIN WITH MINERALS) tablet Place 1 tablet into feeding tube daily.   Yes Historical Provider, MD  Pantoprazole Sodium POWD Place 40 mg into feeding tube daily.    Yes Historical Provider, MD  potassium chloride 20 MEQ/15ML (10%) SOLN Place 10 mEq into feeding tube 2 (two) times daily.   Yes Historical Provider, MD  predniSONE (DELTASONE) 20 MG tablet Place 20 mg into feeding tube daily with breakfast.   Yes Historical Provider, MD  tobramycin (NEBCIN) 10 MG/ML SOLN injection Inject 100 mg  into the vein 2 times daily at 12 noon and 4 pm.   Yes Historical Provider, MD    Family History No family history on file.  Social History Social History  Substance Use Topics  . Smoking status: Never Smoker  . Smokeless tobacco: Never Used  . Alcohol use Not on file     Allergies   Chocolate; Iodine; and Nitroglycerin   Review of Systems Review of Systems  Unable to perform ROS: Other     Physical Exam Updated Vital Signs BP (!) 75/53   Pulse 70   Temp 98.1 F (36.7 C) (Rectal)   Resp 20   Ht  (1.702 m)   Wt 90.7 kg   SpO2 94%   BMI 31.32 kg/m    Physical Exam  Constitutional:  Morbidly obese and chronically ill-appearing  HENT:  Head: Normocephalic and atraumatic.  Right Ear: External ear normal.  Left Ear: External ear normal.  Eyes: Pupils are equal, round, and reactive to light.  Neck:  Tracheostomy in place with no signs of discharge redness or infection  Cardiovascular: Normal rate and normal heart sounds.   Pulmonary/Chest:  Decreased sounds bilateral bases no wheezing noted  Abdominal: Soft. Bowel sounds are normal.  PEG tube in place  Musculoskeletal:  Chronic skin changes bilateral lower extremities  Neurological: He is alert.  Patient shakes his head yes no to some questions  Skin: Skin is warm.  Nursing note and vitals reviewed.    ED Treatments / Results  Labs (all labs ordered are listed, but only abnormal results are displayed) Labs Reviewed  COMPREHENSIVE METABOLIC PANEL - Abnormal; Notable for the following:       Result Value   Potassium 5.4 (*)    CO2 18 (*)    BUN 28 (*)    Calcium 7.8 (*)    Total Protein 6.4 (*)    Albumin 1.8 (*)    Alkaline Phosphatase 142 (*)    All other components within normal limits  CBC WITH DIFFERENTIAL/PLATELET - Abnormal; Notable for the following:    WBC 15.8 (*)    RBC 2.41 (*)    Hemoglobin 6.3 (*)    HCT 20.4 (*)    RDW 22.4 (*)    Neutro Abs 11.9 (*)    All other components within normal limits  URINALYSIS, ROUTINE W REFLEX MICROSCOPIC - Abnormal; Notable for the following:    Color, Urine AMBER (*)    APPearance CLOUDY (*)    Hgb urine dipstick LARGE (*)    Ketones, ur 5 (*)    Protein, ur 100 (*)    Leukocytes, UA MODERATE (*)    Bacteria, UA MANY (*)    Non Squamous Epithelial 0-5 (*)    All other components within normal limits  I-STAT ARTERIAL BLOOD GAS, ED - Abnormal; Notable for the following:    pH, Arterial 7.264 (*)    pO2, Arterial 146.0 (*)    Acid-base deficit 6.0 (*)    All other components within normal limits  CULTURE, BLOOD  (ROUTINE X 2)  CULTURE, BLOOD (ROUTINE X 2)  URINE CULTURE  DIGOXIN LEVEL  PROTIME-INR  I-STAT CG4 LACTIC ACID, ED  I-STAT CG4 LACTIC ACID, ED  I-STAT CG4 LACTIC ACID, ED  I-STAT CG4 LACTIC ACID, ED  TYPE AND SCREEN  PREPARE RBC (CROSSMATCH)    EKG  EKG Interpretation  Date/Time:  Saturday November 05 2016 08:55:28 EDT Ventricular Rate:  87 PR Interval:    QRS Duration:  149 QT Interval:  391 QTC Calculation: 471 R Axis:   -13 Text Interpretation:  Atrial fibrillation Nonspecific intraventricular conduction delay Borderline T abnormalities, lateral leads Confirmed by Silver Parkey MD, Ondra Deboard [phone]) on 11/05/2016 10:41:53 AM       Radiology Dg Chest Port 1 View  Addendum Date: 11/05/2016   ADDENDUM REPORT: 11/05/2016 10:35 ADDENDUM: No PICC line is identified. Electronically Signed   By: Gerome Sam III M.D   On: 11/05/2016 10:35   Result Date: 11/05/2016 CLINICAL DATA:  Hypotension EXAM: PORTABLE CHEST 1 VIEW COMPARISON:  September 02, 2016 FINDINGS: The tracheostomy tube remains in good position. No pneumothorax. The cardiomediastinal silhouette is stable. Bilateral pulmonary opacities remain. More focal opacity in the left retrocardiac region is suggested and stable to mildly improved. No other acute abnormalities identified. IMPRESSION: 1. Bilateral pulmonary opacities. The findings could represent edema or infection. More focal opacity in the left base. Recommend clinical correlation and follow-up to resolution. Electronically Signed: By: Gerome Sam III M.D On: 11/05/2016 09:26   Dg Humerus Left  Result Date: 11/05/2016 CLINICAL DATA:  PICC line placement. EXAM: LEFT HUMERUS - 1 VIEW COMPARISON:  None. FINDINGS: Single portable view shows a short PICC line which is looped back on itself in the mid upper arm, medial to the humerus. IMPRESSION: Short PICC line is looped in mid upper arm soft tissues, medial to the humerus. Electronically Signed   By: Myles Rosenthal M.D.   On: 11/05/2016  12:15    Procedures Procedures (including critical care time)  Medications Ordered in ED Medications  sodium chloride 0.9 % bolus 1,000 mL (not administered)    And  sodium chloride 0.9 % bolus 1,000 mL (not administered)    And  sodium chloride 0.9 % bolus 1,000 mL (not administered)  0.9 %  sodium chloride infusion (not administered)  piperacillin-tazobactam (ZOSYN) IVPB 3.375 g (not administered)  vancomycin (VANCOCIN) IVPB 1000 mg/200 mL premix (not administered)  sodium chloride 0.9 % bolus 1,000 mL (1,000 mLs Intravenous New Bag/Given 11/05/16 1219)  piperacillin-tazobactam (ZOSYN) IVPB 3.375 g (3.375 g Intravenous New Bag/Given 11/05/16 1220)     Initial Impression / Assessment and Plan / ED Course  I have reviewed the triage vital signs and the nursing notes.  Pertinent labs & imaging results that were available during my care of the patient were reviewed by me and considered in my medical decision making (see chart for details).      Discussed with Hayes Ludwig and patient will be seen by critical care. Attempted to call gi, but phones down.  Brandi aware. 1- chronic vent dependence 2- anemia- likely secondary to chronic anemia and gi bleeding 3- gi bleeding- rectal temp probe in rectum on exam with grossly bloody  4-hypotension-likely secondary to bleeding, doubt sepsis as lactic acid normal 5- uti CRITICAL CARE Performed by: Hilario Quarry Total critical care time: 45 minutes Critical care time was exclusive of separately billable procedures and treating other patients. Critical care was necessary to treat or prevent imminent or life-threatening deterioration. Critical care was time spent personally by me on the following activities: development of treatment plan with patient and/or surrogate as well as nursing, discussions with consultants, evaluation of patient's response to treatment, examination of patient, obtaining history from patient or surrogate, ordering and  performing treatments and interventions, ordering and review of laboratory studies, ordering and review of radiographic studies, pulse oximetry and re-evaluation of patient's condition.  Final Clinical Impressions(s) / ED Diagnoses   Final diagnoses:  PICC (peripherally inserted central catheter) in place  Gastrointestinal hemorrhage, unspecified gastrointestinal hemorrhage type  Hypotension, unspecified hypotension type    New Prescriptions New Prescriptions   No medications on file     Margarita Grizzle, MD 11/05/16 1635    Margarita Grizzle, MD 11/05/16 1640    Margarita Grizzle, MD 11/05/16 1657

## 2016-11-05 NOTE — ED Notes (Signed)
Attempted to give report RN to call back  

## 2016-11-05 NOTE — ED Notes (Addendum)
Spoke with Admit MD ok to transfuse pt. Pt being placed on bear hugger Admit MD aware of low Rectal Temp pt on monitor will continue to monitor and will start blood transfusion also Admit MD aware of pt's generalized edema that has increased since arrival to ER.  Pt remains on monitor will continue to monitor for s/s of fluid overload, pt's lung sounds are clear SPO2 100% on vent

## 2016-11-05 NOTE — ED Notes (Signed)
Second sample sent to blood bank, as per blood bank pt has multiple antibodies and screening his blood will take increased time ER Resident at bedside attempting IV access Central line placement unsuccessful

## 2016-11-05 NOTE — Progress Notes (Signed)
eLink Physician-Brief Progress Note Patient Name: Barry [name] DOB: [birth year] MRN: [id number]   Date of Service  11/05/2016  HPI/Events of Note  Hypoglycemia with blood sugars in the 60s.  eICU Interventions  Changed to D5W at 30 ml/hr Continue to monitor blood sugar     Intervention Category Intermediate Interventions: Other:  DETERDING,ELIZABETH 11/05/2016, 11:41 PM

## 2016-11-05 NOTE — ED Notes (Signed)
Spoke with blood bank blood is now ready for infusion

## 2016-11-05 NOTE — ED Notes (Signed)
ER MD aware of critical lab and need for IV access will attempt to gain IV access while waiting to hear from Radiology about utilizing the PICC line

## 2016-11-05 NOTE — Progress Notes (Signed)
eLink Physician-Brief Progress Note Patient Name: Neils Siracusa DOB: [birth year] MRN: [id number]   Date of Service  11/05/2016  HPI/Events of Note  Hypothermia - Temp + 94.3 F.   eICU Interventions  Will order [employer].      Intervention Category Intermediate Interventions: Other:  Holden Draughon Dennard Nip 11/05/2016, 7:08 PM

## 2016-11-06 ENCOUNTER — Other Ambulatory Visit (HOSPITAL_COMMUNITY): Payer: Medicare Other

## 2016-11-06 ENCOUNTER — Inpatient Hospital Stay (HOSPITAL_COMMUNITY): Payer: Medicare Other

## 2016-11-06 DIAGNOSIS — A419 Sepsis, unspecified organism: Secondary | ICD-10-CM | POA: Diagnosis not present

## 2016-11-06 DIAGNOSIS — L8990 Pressure ulcer of unspecified site, unspecified stage: Secondary | ICD-10-CM | POA: Insufficient documentation

## 2016-11-06 DIAGNOSIS — J9600 Acute respiratory failure, unspecified whether with hypoxia or hypercapnia: Secondary | ICD-10-CM

## 2016-11-06 DIAGNOSIS — R579 Shock, unspecified: Secondary | ICD-10-CM | POA: Diagnosis not present

## 2016-11-06 DIAGNOSIS — K922 Gastrointestinal hemorrhage, unspecified: Secondary | ICD-10-CM | POA: Diagnosis not present

## 2016-11-06 LAB — GLUCOSE, CAPILLARY
GLUCOSE-CAPILLARY: 115 mg/dL — AB (ref 65–99)
GLUCOSE-CAPILLARY: 122 mg/dL — AB (ref 65–99)
Glucose-Capillary: 82 mg/dL (ref 65–99)
Glucose-Capillary: 89 mg/dL (ref 65–99)
Glucose-Capillary: 92 mg/dL (ref 65–99)
Glucose-Capillary: 93 mg/dL (ref 65–99)

## 2016-11-06 LAB — CBC
HEMATOCRIT: 26.7 % — AB (ref 39.0–52.0)
Hemoglobin: 8.4 g/dL — ABNORMAL LOW (ref 13.0–17.0)
MCH: 26.3 pg (ref 26.0–34.0)
MCHC: 31.5 g/dL (ref 30.0–36.0)
MCV: 83.4 fL (ref 78.0–100.0)
Platelets: 180 10*3/uL (ref 150–400)
RBC: 3.2 MIL/uL — ABNORMAL LOW (ref 4.22–5.81)
RDW: 21.4 % — ABNORMAL HIGH (ref 11.5–15.5)
WBC: 16.6 10*3/uL — AB (ref 4.0–10.5)

## 2016-11-06 LAB — URINE CULTURE

## 2016-11-06 LAB — MRSA PCR SCREENING: MRSA by PCR: POSITIVE — AB

## 2016-11-06 MED ORDER — MUPIROCIN 2 % EX OINT
1.0000 "application " | TOPICAL_OINTMENT | Freq: Two times a day (BID) | CUTANEOUS | Status: AC
  Administered 2016-11-06 – 2016-11-10 (×10): 1 via NASAL
  Filled 2016-11-06 (×2): qty 22

## 2016-11-06 MED ORDER — LACTULOSE 10 GM/15ML PO SOLN
20.0000 g | Freq: Two times a day (BID) | ORAL | Status: DC
  Administered 2016-11-07 – 2016-11-10 (×8): 20 g via ORAL
  Filled 2016-11-06 (×10): qty 30

## 2016-11-06 MED ORDER — ONDANSETRON HCL 4 MG/2ML IJ SOLN
4.0000 mg | Freq: Four times a day (QID) | INTRAMUSCULAR | Status: DC
  Filled 2016-11-06: qty 2

## 2016-11-06 MED ORDER — DOPAMINE-DEXTROSE 3.2-5 MG/ML-% IV SOLN
0.0000 ug/kg/min | INTRAVENOUS | Status: DC
  Administered 2016-11-06 (×2): 10 ug/kg/min via INTRAVENOUS
  Filled 2016-11-06 (×2): qty 250

## 2016-11-06 MED ORDER — CHLORHEXIDINE GLUCONATE CLOTH 2 % EX PADS
6.0000 | MEDICATED_PAD | Freq: Every day | CUTANEOUS | Status: AC
  Administered 2016-11-06 – 2016-11-10 (×4): 6 via TOPICAL

## 2016-11-06 MED ORDER — SODIUM CHLORIDE 0.9 % IV SOLN
0.0000 ug/min | INTRAVENOUS | Status: DC
  Administered 2016-11-06 – 2016-11-07 (×2): 90 ug/min via INTRAVENOUS
  Administered 2016-11-07: 140 ug/min via INTRAVENOUS
  Administered 2016-11-07: 90 ug/min via INTRAVENOUS
  Administered 2016-11-08: 150 ug/min via INTRAVENOUS
  Administered 2016-11-08: 170 ug/min via INTRAVENOUS
  Filled 2016-11-06 (×7): qty 4

## 2016-11-06 MED ORDER — ONDANSETRON HCL 4 MG/2ML IJ SOLN
4.0000 mg | Freq: Four times a day (QID) | INTRAMUSCULAR | Status: DC | PRN
  Administered 2016-11-06 (×2): 4 mg via INTRAVENOUS
  Filled 2016-11-06 (×2): qty 2

## 2016-11-06 MED ORDER — ORAL CARE MOUTH RINSE
15.0000 mL | OROMUCOSAL | Status: DC
  Administered 2016-11-06 – 2016-11-13 (×74): 15 mL via OROMUCOSAL

## 2016-11-06 MED ORDER — LACTULOSE 20 GM/30ML PO SOLN
20.0000 g | Freq: Two times a day (BID) | ORAL | Status: DC
  Filled 2016-11-06: qty 30

## 2016-11-06 MED ORDER — CHLORHEXIDINE GLUCONATE 0.12% ORAL RINSE (MEDLINE KIT)
15.0000 mL | Freq: Two times a day (BID) | OROMUCOSAL | Status: DC
  Administered 2016-11-06 – 2016-11-13 (×15): 15 mL via OROMUCOSAL

## 2016-11-06 MED ORDER — SODIUM CHLORIDE 0.9 % IV SOLN
0.0000 ug/min | INTRAVENOUS | Status: DC
  Administered 2016-11-06: 250 ug/min via INTRAVENOUS
  Administered 2016-11-06: 20 ug/min via INTRAVENOUS
  Administered 2016-11-06 (×2): 90 ug/min via INTRAVENOUS
  Filled 2016-11-06 (×6): qty 1

## 2016-11-06 NOTE — Progress Notes (Signed)
Since no PICC at this point, I called lab to let them know he is a lab draw. Will continue to monitor.

## 2016-11-06 NOTE — Progress Notes (Signed)
Lt. Upper arm malpositioned non functioning midline removed.  Pressure held until no bleeding.  Pressure dressing applied.

## 2016-11-06 NOTE — Progress Notes (Signed)
overnight events: -Hypotensive and bradycardic required intiation of dopamine with the addition of 2 amps of bicarb.  -D/c'd malpositioned Left Arm PICC.  -foley team Unable to place temp sensing foley required use of coude cath. -Multiple failed attempts of lab draws, MD aware of issues.  -Rectal temperature sensor placed for hypothermia -2/2 Blood completed at 0745

## 2016-11-06 NOTE — Progress Notes (Signed)
PULMONARY / CRITICAL CARE MEDICINE   Name: Barry [name] MRN: [id number] DOB: 1965-12-14    ADMISSION DATE:  11/05/2016 CONSULTATION DATE:  11/05/2016  REFERRING MD:  Dr. Rosalia Hammers  CHIEF COMPLAINT:  hypotension  HISTORY OF PRESENT ILLNESS:   Barry [name] is a 51yrs Male with history of HTN, mitochondrial disease causing myopathy, recently vent dependent at [facility], sCHF, a-fib (on dig), seizure disorder. He was admitted in September 2017 for respiratory failure at an outside facility. During that admission, he ended up being vent dependent and had a tracheostomy.  Unfortunately, I am unable to obtain history from the patient as he is encephalopathic so chart review was done. Patient's blood pressure chronically  runs low for which he is on midodrine  Patient was sent from [facility] facility because of relative hypotension, questionable bloody stools. At the ER, blood pressure was soft. Hemoglobin was 6 from a baseline of 7.5. There was concern for GI bleed, possible sepsis so PCCM was asked to admit. He was a difficult IV access for which ED physician has tried several times and after several hours, was able to get 2 peripheral IVs on him. He ended up getting 4 L of saline and blood pressure remained 90/60, heart rate in the 60s.  SUBJECTIVE:  Patient is continued to be hypotensive overnight, bradycardic. Dopamine was added, currently attend all through her peripheral IV. He received 4 L IV fluid and blood products He has had hypoglycemia, D5 added Unable to get morning labs  VITAL SIGNS: BP (!) 100/44 (BP Location: Right Arm)   Pulse 90   Temp 97.4 F (36.3 C) (Rectal)   Resp (!) 24   Ht  (1.702 m)   Wt 127.4 kg (280 lb 13.9 oz)   SpO2 99%   BMI 43.99 kg/m   HEMODYNAMICS:    VENTILATOR SETTINGS: Vent Mode: PRVC FiO2 (%):  [40 %-100 %] 60 % Set Rate:  [30 bmp] 30 bmp Vt Set:  [400 mL-550 mL] 400 mL PEEP:  [5 cmH20] 5 cmH20 Plateau Pressure:  [31 cmH20-47 cmH20] 35  cmH20  INTAKE / OUTPUT: I/O last 3 completed shifts: In: 4963.6 [I.V.:373.6; Blood:440; IV Piggyback:4150] Out: 600 [Urine:600]  PHYSICAL EXAMINATION: General:  Poorly responsive, ill-appearing man, ventilated Neuro: Obtunded, disconjugate gaze, does not follow any commands, does have a cough when suctioned HEENT: Tracheostomy in place with good position, stoma clean dry intact Cardiovascular: Regular, distant, no murmur (on dopamine) Lungs: Bilateral inspiratory crackles Abdomen:  Soft, slightly distended, PEG tube site clean dry intact, hypoactive bowel sounds Musculoskeletal:  Anasarca, edema up to bilateral thighs Skin:  No rash, stage II sacral decubitus ulcer reported by RN  LABS:  BMET  Recent Labs Lab 11/05/16 1004  NA 135  K 5.4*  CL 110  CO2 18*  BUN 28*  CREATININE 0.63  GLUCOSE 91    Electrolytes  Recent Labs Lab 11/05/16 1004  CALCIUM 7.8*    CBC  Recent Labs Lab 11/05/16 1004 11/05/16 2345  WBC 15.8* 16.6*  HGB 6.3* 8.4*  HCT 20.4* 26.7*  PLT PLATELETS APPEAR DECREASED 180    Coag's  Recent Labs Lab 11/05/16 1120  INR 1.16    Sepsis Markers  Recent Labs Lab 11/05/16 1011 11/05/16 1141  LATICACIDVEN 0.76 0.69    ABG  Recent Labs Lab 11/05/16 0955 11/05/16 2313  PHART 7.264* 7.224*  PCO2ART 44.2 47.5  PO2ART 146.0* 25.0*    Liver Enzymes  Recent Labs Lab 11/05/16 1004  AST 26  ALT 23  ALKPHOS 142*  BILITOT 1.0  ALBUMIN 1.8*    Cardiac Enzymes No results for input(s): TROPONINI, PROBNP in the last 168 hours.  Glucose  Recent Labs Lab 11/05/16 2316 11/06/16 0407 11/06/16 0902  GLUCAP 62* 89 115*    Imaging Dg Chest Port 1 View  Result Date: 11/06/2016 CLINICAL DATA:  Acute resp fail EXAM: PORTABLE CHEST 1 VIEW COMPARISON:  Chest x-rays dated 11/05/2016 and 09/02/2016. FINDINGS: Cardiomediastinal silhouette is grossly stable in size and configuration. Tracheostomy remains in the midline. Persistent  bilateral airspace opacities, slightly worsened on the left and at the right lung base compared to yesterday's exam. No pneumothorax seen. IMPRESSION: Worsening airspace opacities bilaterally, pulmonary edema versus pneumonia, favor pulmonary edema. Electronically Signed   By: Bary Richard M.D.   On: 11/06/2016 07:02   Dg Humerus Left  Result Date: 11/05/2016 CLINICAL DATA:  PICC line placement. EXAM: LEFT HUMERUS - 1 VIEW COMPARISON:  None. FINDINGS: Single portable view shows a short PICC line which is looped back on itself in the mid upper arm, medial to the humerus. IMPRESSION: Short PICC line is looped in mid upper arm soft tissues, medial to the humerus. Electronically Signed   By: Myles Rosenthal M.D.   On: 11/05/2016 12:15     STUDIES:    CULTURES: Sputum 4/7 > MRSA 4/7 > Blood 4/7 >   ANTIBIOTICS: Zosyn 4/7 > Vanc 4/7 >   SIGNIFICANT EVENTS:   LINES/TUBES: Trache 04/2016 > OSH L PICC > OSH  DISCUSSION: Patient is a 51 year old male, chronic ventilator dependent, related to mitochondrial disease, admitted from [facility] facility because of hypotension, bloody stools. There is also concern for sepsis/occult infection.  ASSESSMENT / PLAN:  PULMONARY A: Acute on chronic hypoxemic hypercapnic respiratory failure secondary to likely pulmonary edema, concern for HCAP Patient is on chronic ventilator because of muscular weakness related to mitochondrial disease. Probable healthcare associated pneumonia P:   Continue current ventilatory support, on volume control. Per report he was on pressure control with high pressures while at [facility] Continue current broad-spectrum antibiotics as below Scheduled bronchodilators Would like to initiate diuretics if/when his blood pressure will tolerate   CARDIOVASCULAR A:  Chronic hypotension on midodrine.  Concern for lower blood pressure than baseline likely related to sepsis, possible GI bleed CHF Chronic afib P:  Wean dopamine as  able He will need a PICC line versus central access. PICC ordered Continue Midodrin Follow CBC, currently stable Defer anticoagulation at this time in the setting of blood loss   RENAL A:   Total-body massive Volume overload P:   Will need to initiate slow chronic diuretics when this acute event is improved   GASTROINTESTINAL A:   GI bleeding possibly from from PUD, possible contribution of proctitis Impacted stool P:   Attempt disimpaction Follow CBC and give blood products for goal hemoglobin greater than 7 GI consulted in the emergency department, not clear to me that they have seen the patient. We will defer for now given his hematemesis stability. Call them back if clinical changes NPO / no TF for now   HEMATOLOGIC A:   Anemia 2/2 UGIBleed P:  Transfusion goal hemoglobin greater than 7 given his chronically low baseline   INFECTIOUS A:   Suspected HCAP,  Possible UTI P:   Continue broad antibody coverage with vancomycin and Zosyn pending culture data Lactate not elevated, no leukocytosis   ENDOCRINE A:   No issues P:   Sliding-scale insulin and CBG monitoring  NEUROLOGIC A:   Encephalopathy related to sepsis + chronic deconditioning/medical illness P:   RASS goal: 0 - -1 When necessary sedation as ordered   FAMILY  - Updates: No family at bedside.  - Inter-disciplinary family meet or Palliative Care meeting due by:  11/12/16   Independent CC time 34 minutes   Levy Pupa, MD, PhD 11/06/2016, 10:00 AM [facility] [phone] or if no answer [phone]

## 2016-11-06 NOTE — Progress Notes (Signed)
eLink Physician-Brief Progress Note Patient Name: Barry [name] DOB: 12-30-1965 MRN: [id number]   Date of Service  11/06/2016  HPI/Events of Note  PICC line placed at OSH malpositioned and not evident on PCXR.  Not being used  eICU Interventions  Plan: D/C PICC line     Intervention Category Minor Interventions: Routine modifications to care plan (e.g. PRN medications for pain, fever)  Barry [name] 11/06/2016, 12:32 AM

## 2016-11-06 NOTE — Progress Notes (Signed)
eLink Physician-Brief Progress Note Patient Name: Barry [name] DOB: 1966/02/11 MRN: [id number]   Date of Service  11/06/2016  HPI/Events of Note  Called by PICC Team Nurse d/t the fact that blood cultures should be negative for 48 hours prior to PICC placement. Blood cultures are now negative at 24 hours. CVL placement in ED was unsuccessful and L arm midline catheter placed at [facility] went distally and has been removed. Patient only has periferial IV's and is one Phenylephrine and Dopamine IV infusion. In addition, he is on Vancomycin IV as well. Will need to proceed with PICC line placement at this time under less than ideal conditions. However, we have very limited options and no good options at this time. PICC line will need to be removed should he spike a temperature or blood cultures turn positive.   eICU Interventions  See above.         Sommer,Steven Dennard Nip 11/06/2016, 5:35 PM

## 2016-11-06 NOTE — Progress Notes (Signed)
Attempted to call family for PICC consent.  No answer from the numbers available on the chart.  Pt unable to provide consent per RN.  Per Dr. Arsenio Loader, no need to give medical necessity for PICC placement due to 2 PIV working well at this time.  Offered to come place 3rd PIV site if needed, Melissa RN states not necessary at this time.  RN aware PICC will not be done this evening.

## 2016-11-07 ENCOUNTER — Inpatient Hospital Stay (HOSPITAL_COMMUNITY): Payer: Medicare Other

## 2016-11-07 DIAGNOSIS — R0600 Dyspnea, unspecified: Secondary | ICD-10-CM

## 2016-11-07 DIAGNOSIS — A419 Sepsis, unspecified organism: Secondary | ICD-10-CM | POA: Diagnosis not present

## 2016-11-07 DIAGNOSIS — J9600 Acute respiratory failure, unspecified whether with hypoxia or hypercapnia: Secondary | ICD-10-CM | POA: Diagnosis not present

## 2016-11-07 LAB — ECHOCARDIOGRAM COMPLETE
HEIGHTINCHES: 67 in
Weight: 4493.86 oz

## 2016-11-07 LAB — BASIC METABOLIC PANEL
ANION GAP: 10 (ref 5–15)
BUN: 20 mg/dL (ref 6–20)
CALCIUM: 8.9 mg/dL (ref 8.9–10.3)
CO2: 18 mmol/L — ABNORMAL LOW (ref 22–32)
Chloride: 113 mmol/L — ABNORMAL HIGH (ref 101–111)
Creatinine, Ser: 0.52 mg/dL — ABNORMAL LOW (ref 0.61–1.24)
GFR calc Af Amer: >60 mL/min (ref >60)
Glucose, Bld: 83 mg/dL (ref 65–99)
POTASSIUM: 4.6 mmol/L (ref 3.5–5.1)
Sodium: 141 mmol/L (ref 135–145)

## 2016-11-07 LAB — GLUCOSE, CAPILLARY
GLUCOSE-CAPILLARY: 79 mg/dL (ref 65–99)
GLUCOSE-CAPILLARY: 59 mg/dL — AB (ref 65–99)
Glucose-Capillary: 108 mg/dL — ABNORMAL HIGH (ref 65–99)
Glucose-Capillary: 64 mg/dL — ABNORMAL LOW (ref 65–99)
Glucose-Capillary: 84 mg/dL (ref 65–99)

## 2016-11-07 LAB — CBC
HCT: 31.4 % — ABNORMAL LOW (ref 39.0–52.0)
Hemoglobin: 9.9 g/dL — ABNORMAL LOW (ref 13.0–17.0)
MCH: 26.6 pg (ref 26.0–34.0)
MCHC: 31.5 g/dL (ref 30.0–36.0)
MCV: 84.4 fL (ref 78.0–100.0)
PLATELETS: 135 10*3/uL — AB (ref 150–400)
RBC: 3.72 MIL/uL — AB (ref 4.22–5.81)
RDW: 21 % — ABNORMAL HIGH (ref 11.5–15.5)
WBC: 11.9 10*3/uL — AB (ref 4.0–10.5)

## 2016-11-07 LAB — PHOSPHORUS: Phosphorus: 5.7 mg/dL — ABNORMAL HIGH (ref 2.5–4.6)

## 2016-11-07 LAB — MAGNESIUM: Magnesium: 2.1 mg/dL (ref 1.7–2.4)

## 2016-11-07 MED ORDER — FUROSEMIDE 10 MG/ML IJ SOLN
40.0000 mg | Freq: Once | INTRAMUSCULAR | Status: AC
  Administered 2016-11-07: 40 mg via INTRAVENOUS
  Filled 2016-11-07: qty 4

## 2016-11-07 MED ORDER — PERFLUTREN LIPID MICROSPHERE
1.0000 mL | INTRAVENOUS | Status: AC | PRN
  Administered 2016-11-07: 3 mL via INTRAVENOUS
  Filled 2016-11-07: qty 10

## 2016-11-07 MED ORDER — DEXTROSE 50 % IV SOLN
25.0000 mL | Freq: Once | INTRAVENOUS | Status: AC
  Administered 2016-11-07: 25 mL via INTRAVENOUS

## 2016-11-07 MED ORDER — PERFLUTREN LIPID MICROSPHERE
INTRAVENOUS | Status: AC
  Administered 2016-11-07: 3 mL via INTRAVENOUS
  Filled 2016-11-07: qty 10

## 2016-11-07 MED ORDER — DEXTROSE 50 % IV SOLN
INTRAVENOUS | Status: AC
  Administered 2016-11-07: 25 mL via INTRAVENOUS
  Filled 2016-11-07: qty 50

## 2016-11-07 MED ORDER — IPRATROPIUM-ALBUTEROL 0.5-2.5 (3) MG/3ML IN SOLN
3.0000 mL | Freq: Four times a day (QID) | RESPIRATORY_TRACT | Status: DC
  Administered 2016-11-07 – 2016-11-13 (×24): 3 mL via RESPIRATORY_TRACT
  Filled 2016-11-07 (×24): qty 3

## 2016-11-07 MED ORDER — CHLORHEXIDINE GLUCONATE CLOTH 2 % EX PADS
6.0000 | MEDICATED_PAD | Freq: Every day | CUTANEOUS | Status: DC
  Administered 2016-11-07 – 2016-11-11 (×2): 6 via TOPICAL

## 2016-11-07 MED ORDER — SODIUM CHLORIDE 0.9% FLUSH: 10.0000 mL | INTRAVENOUS | Status: DC | PRN

## 2016-11-07 MED ORDER — DEXTROSE 10 % IV SOLN
INTRAVENOUS | Status: DC
  Administered 2016-11-07 – 2016-11-08 (×3): via INTRAVENOUS

## 2016-11-07 MED ORDER — SODIUM CHLORIDE 0.9% FLUSH
10.0000 mL | Freq: Two times a day (BID) | INTRAVENOUS | Status: DC
  Administered 2016-11-07 – 2016-11-13 (×11): 10 mL

## 2016-11-07 NOTE — Progress Notes (Signed)
Initial Nutrition Assessment  DOCUMENTATION CODES:   Obesity unspecified  INTERVENTION:  Recommend initiating Pepup Protocol, TF via PEG with Vital High Protein at goal rate of 70 ml/h (1680 ml per day) to provide 1680 kcals, 147 gm protein, 1411 ml free water daily.   NUTRITION DIAGNOSIS:   Inadequate oral intake related to inability to eat as evidenced by NPO status.   GOAL:   Provide needs based on ASPEN/SCCM guidelines   MONITOR:   TF tolerance, Weight trends, Labs, I & O's, Skin, Vent status  REASON FOR ASSESSMENT:   Ventilator    ASSESSMENT:   51 year old Male with history of HTN, mitochondrial disease causing myopathy, recently vent dependent at [facility], sCHF, a-fib (on dig), seizure disorder. He was admitted in September 2017 for respiratory failure at an outside facility. During that admission, he ended up being vent dependent and had a tracheostomy. Pt admitted from [facility] due to relative hypotension and questionable bloody stools.   Patient is currently intubated on ventilator support MV: 11.8 L/min Temp (24hrs), Avg:98.5 F (36.9 C), Min:97.8 F (36.6 C), Max:99 F (37.2 C)  Pt currently with significant edema making it difficult to determine actual weight. He appears well-nourished on exam with moderate edema, mild muscle wasting of temples. Per RN, pt had large BM earlier today. Pt has NGT to suction and has PEG. Per paper chart, pt was receiving Osmolite 1.5@65  ml/hr via PEG PTA. This would provide 2340 kcal, 98 grams of protein, and 1186 ml of water.   Labs: low glucose (59), elevated phosphorus (5.7), low hemoglobin  Diet Order:  Diet NPO time specified  Skin:  Wound (see comment) (Stage II PI on buttocks)  Last BM:  4/9  Height:   Ht Readings from Last 1 Encounters:  11/05/16  (1.702 m)    Weight:   Wt Readings from Last 1 Encounters:  11/06/16 280 lb 13.9 oz (127.4 kg)    Ideal Body Weight:  67.3 kg  BMI:  Body mass  index is 43.99 kg/m.  Estimated Nutritional Needs:   Kcal:  1610-9604  Protein:  135-168 grams  Fluid:  2 L/day  EDUCATION NEEDS:   No education needs identified at this time  Dorothea Ogle RD, LDN, CSP Inpatient Clinical Dietitian Pager: [phone] After Hours Pager: [phone]

## 2016-11-07 NOTE — Progress Notes (Signed)
Peripherally Inserted Central Catheter/Midline Placement  The IV Nurse has discussed with the patient and/or persons authorized to consent for the patient, the purpose of this procedure and the potential benefits and risks involved with this procedure.  The benefits include less needle sticks, lab draws from the catheter, and the patient may be discharged home with the catheter. Risks include, but not limited to, infection, bleeding, blood clot (thrombus formation), and puncture of an artery; nerve damage and irregular heartbeat and possibility to perform a PICC exchange if needed/ordered by physician.  Alternatives to this procedure were also discussed.  Bard Power PICC patient education guide, fact sheet on infection prevention and patient information card has been provided to patient /or left at bedside.    PICC/Midline Placement Documentation     Consent obtained by sister- in- law Lupita Leash Meissner   Franne Grip Renee 11/07/2016, 11:10 AM

## 2016-11-07 NOTE — Progress Notes (Signed)
  Echocardiogram 2D Echocardiogram has been performed.  Arvil Chaco 11/07/2016, 1:46 PM

## 2016-11-07 NOTE — Progress Notes (Signed)
PULMONARY / CRITICAL CARE MEDICINE   Name: Barry [name] MRN: [id number] DOB: [birth year]    ADMISSION DATE:  11/05/2016 CONSULTATION DATE:  11/05/2016  REFERRING MD:  Dr. Rosalia Hammers  CHIEF COMPLAINT:  hypotension  HISTORY OF PRESENT ILLNESS:   Barry [name] is a 51yrs Male with history of HTN, mitochondrial disease causing myopathy, recently vent dependent at [facility], sCHF, a-fib (on dig), seizure disorder. He was admitted in September 2017 for respiratory failure at an outside facility. During that admission, he ended up being vent dependent and had a tracheostomy.  Unfortunately, I am unable to obtain history from the patient as he is encephalopathic so chart review was done. Patient's blood pressure chronically  runs low for which he is on midodrine  [facility] was [facility] from [facility] facility because of relative hypotension, questionable bloody stools. At the ER, blood pressure was soft. Hemoglobin was 6 from a baseline of 7.5. There was concern for GI bleed, possible sepsis so PCCM was asked to admit. He was a difficult IV access for which ED physician has tried several times and after several hours, was able to get 2 peripheral IVs on him. He ended up getting 4 L of saline and blood pressure remained 90/60, heart rate in the 60s.   SUBJECTIVE: Dopamine weaned off this morning and now on phenylephrine. He is unable to articulate to me though can follow commands.   VITAL SIGNS: BP (!) 73/43   Pulse (!) 103   Temp 98.6 F (37 C) (Rectal)   Resp (!) 30   Ht  (1.702 m)   Wt 280 lb 13.9 oz (127.4 kg)   SpO2 100%   BMI 43.99 kg/m   HEMODYNAMICS:    VENTILATOR SETTINGS: Vent Mode: PRVC FiO2 (%):  [40 %-60 %] 40 % Set Rate:  [30 bmp] 30 bmp Vt Set:  [400 mL] 400 mL PEEP:  [5 cmH20] 5 cmH20 Plateau Pressure:  [32 cmH20-34 cmH20] 32 cmH20  INTAKE / OUTPUT: I/O last 3 completed shifts: In: 3450.6 [I.V.:2044.7; Blood:785.8; Other:120; IV Piggyback:500] Out: 2050  [Urine:2050]  PHYSICAL EXAMINATION:  Physical Exam  Constitutional: No distress.  Chronically, ill-appearing man.   Eyes:  Dysconjugate gaze  Neck:  Tracheosotomy in place  Cardiovascular: Normal rate and regular rhythm.   Pulmonary/Chest: Effort normal. No respiratory distress.  Abdominal: Soft.  PEG tube in place.  Musculoskeletal: He exhibits edema (Generalized).  Neurological:  Unable to articulate words though opens/closes eyes and lifts left arm on command.  Skin: He is not diaphoretic.     LABS:  BMET  Recent Labs Lab 11/05/16 1004 11/07/16 0513  NA 135 141  K 5.4* 4.6  CL 110 113*  CO2 18* 18*  BUN 28* 20  CREATININE 0.63 0.52*  GLUCOSE 91 83    Electrolytes  Recent Labs Lab 11/05/16 1004 11/07/16 0513  CALCIUM 7.8* 8.9  MG  --  2.1  PHOS  --  5.7*    CBC  Recent Labs Lab 11/05/16 1004 11/05/16 2345 11/07/16 0513  WBC 15.8* 16.6* 11.9*  HGB 6.3* 8.4* 9.9*  HCT 20.4* 26.7* 31.4*  PLT PLATELETS APPEAR DECREASED 180 135*    Coag's  Recent Labs Lab 11/05/16 1120  INR 1.16    Sepsis Markers  Recent Labs Lab 11/05/16 1011 11/05/16 1141  LATICACIDVEN 0.76 0.69    ABG  Recent Labs Lab 11/05/16 0955 11/05/16 2313  PHART 7.264* 7.224*  PCO2ART 44.2 47.5  PO2ART 146.0* 25.0*Axton CihlarEnzymes  Recent Labs Lab 11/05/16 1004  AST 26  ALT 23  ALKPHOS 142*  BILITOT 1.0  ALBUMIN 1.8*    Glucose  Recent Labs Lab 11/06/16 0902 11/06/16 1145 11/06/16 1656 11/06/16 2024 11/06/16 2338 11/07/16 0804  GLUCAP 115* 122* 93 92 82 79    Imaging Dg Abd Portable 1v  Result Date: 11/07/2016 CLINICAL DATA:  Nasogastric tube placement.  Initial encounter. EXAM: PORTABLE ABDOMEN - 1 VIEW COMPARISON:  None. FINDINGS: The patient's enteric tube is noted ending overlying the body of the stomach. The visualized bowel gas pattern is grossly unremarkable, though difficult to fully assess given positioning. No free intra- abdominal  air is seen, though evaluation for free air is limited on a single supine view. An external pacing pad is noted. No acute osseous abnormalities are seen. IMPRESSION: Enteric tube noted ending overlying the body of the stomach. Electronically Signed   By: Roanna Raider M.D.   On: 11/07/2016 01:37    STUDIES:    CULTURES: Sputum 4/7 > MRSA 4/7 > Blood 4/7 >   ANTIBIOTICS: Zosyn 4/7 > Vanc 4/7 >   SIGNIFICANT EVENTS: 4/7 Admitted to ICU  LINES/TUBES: Trache 04/2016 > OSH L PICC > OSH  DISCUSSION: Mr. Porche is a 51 year old man, chronic ventilator dependent, related to mitochondrial disease, admitted from [facility] facility because of hypotension, bloody stools. There is also concern for sepsis/occult infection.  ASSESSMENT / PLAN:  PULMONARY A: Acute on chronic hypoxemic hypercapnic respiratory failure secondary to likely pulmonary edema, concern for HCAP Patient is on chronic ventilator because of muscular weakness related to mitochondrial disease. Probable healthcare associated pneumonia P:   -Continue current ventilatory support, on volume control. Per report he was on pressure control with high pressures while at [facility] -Continue current broad-spectrum antibiotics as below -Scheduled bronchodilators -Consider trial of Lasix 40 mg IV though MAP 52 off dopamine   CARDIOVASCULAR A:  Chronic hypotension on midodrine: concern for lower blood pressure than baseline likely related to sepsis, possible GI bleed CHF: No echo on file. Chronic a-fib  P:  -Continue phenylephrine as tolerated -Follow CBC -Follow-up repeat echo  RENAL A:   Total-body massive Volume overload  P:   -Diuresis as noted above   GASTROINTESTINAL A:   GI bleeding: FOBT+ on admission, possibly from from PUD, possible contribution of proctitis Impacted stool  P:   -Consider assessing   HEMATOLOGIC A:    Anemia 2/2 UGI Bleed: Hb 9.9 this morning, up from 6.3 on 4/7 s/p pRBC x 2.   P:   -Transfusion goal hemoglobin greater than 7 given his chronically low baseline   INFECTIOUS A:   Infection: HCAP vs UTI   P:   -Continue broad antibody coverage with vancomycin and Zosyn pending culture data [Abx Day 3] -Lactate not elevated, no leukocytosis   ENDOCRINE A:   No issues  P:   -Sliding-scale insulin and CBG monitoring   NEUROLOGIC A:   Encephalopathy related to sepsis + chronic deconditioning/medical illness: Baseline unclear as he is able to follow commands though cannot understand his speech.  P:   -RASS goal: 0 - -1 -When necessary sedation as ordered   FAMILY  - Updates: No family at bedside.  - Inter-disciplinary family meet or Palliative Care meeting due by:  11/12/16   Heywood Iles, PGY3 Internal Medicine Pager: [phone]

## 2016-11-07 NOTE — Progress Notes (Signed)
eLink Physician-Brief Progress Note Patient Name: Barry [name] DOB: May 07, 1966 MRN: [id number]   Date of Service  11/07/2016  HPI/Events of Note  Repeated hypoglycemia npo  eICU Interventions  Dc IVFs Start D10 @ 30/h     Intervention Category Intermediate Interventions: Other:  Barry Petrides V. 11/07/2016, 5:45 PM

## 2016-11-08 ENCOUNTER — Inpatient Hospital Stay (HOSPITAL_COMMUNITY): Payer: Medicare Other

## 2016-11-08 DIAGNOSIS — Z7189 Other specified counseling: Secondary | ICD-10-CM | POA: Diagnosis not present

## 2016-11-08 DIAGNOSIS — J9600 Acute respiratory failure, unspecified whether with hypoxia or hypercapnia: Secondary | ICD-10-CM | POA: Diagnosis not present

## 2016-11-08 DIAGNOSIS — J9611 Chronic respiratory failure with hypoxia: Secondary | ICD-10-CM | POA: Diagnosis not present

## 2016-11-08 DIAGNOSIS — Z515 Encounter for palliative care: Secondary | ICD-10-CM | POA: Diagnosis not present

## 2016-11-08 DIAGNOSIS — R627 Adult failure to thrive: Secondary | ICD-10-CM

## 2016-11-08 DIAGNOSIS — A419 Sepsis, unspecified organism: Secondary | ICD-10-CM | POA: Diagnosis not present

## 2016-11-08 LAB — GLUCOSE, CAPILLARY
GLUCOSE-CAPILLARY: 108 mg/dL — AB (ref 65–99)
GLUCOSE-CAPILLARY: 133 mg/dL — AB (ref 65–99)
GLUCOSE-CAPILLARY: 148 mg/dL — AB (ref 65–99)
GLUCOSE-CAPILLARY: 149 mg/dL — AB (ref 65–99)
Glucose-Capillary: 99 mg/dL (ref 65–99)
Glucose-Capillary: 111 mg/dL — ABNORMAL HIGH (ref 65–99)
Glucose-Capillary: 61 mg/dL — ABNORMAL LOW (ref 65–99)
Glucose-Capillary: 72 mg/dL (ref 65–99)
Glucose-Capillary: 80 mg/dL (ref 65–99)

## 2016-11-08 LAB — BASIC METABOLIC PANEL
Anion gap: 8 (ref 5–15)
BUN: 14 mg/dL (ref 6–20)
CO2: 18 mmol/L — ABNORMAL LOW (ref 22–32)
Calcium: 8.6 mg/dL — ABNORMAL LOW (ref 8.9–10.3)
Chloride: 116 mmol/L — ABNORMAL HIGH (ref 101–111)
Creatinine, Ser: 0.54 mg/dL — ABNORMAL LOW (ref 0.61–1.24)
Glucose, Bld: 125 mg/dL — ABNORMAL HIGH (ref 65–99)
Potassium: 3.3 mmol/L — ABNORMAL LOW (ref 3.5–5.1)
SODIUM: 142 mmol/L (ref 135–145)

## 2016-11-08 LAB — CBC
HEMATOCRIT: 25.1 % — AB (ref 39.0–52.0)
Hemoglobin: 7.9 g/dL — ABNORMAL LOW (ref 13.0–17.0)
MCH: 26.2 pg (ref 26.0–34.0)
MCHC: 31.5 g/dL (ref 30.0–36.0)
MCV: 83.1 fL (ref 78.0–100.0)
PLATELETS: 141 10*3/uL — AB (ref 150–400)
RBC: 3.02 MIL/uL — AB (ref 4.22–5.81)
RDW: 20.6 % — AB (ref 11.5–15.5)
WBC: 10.4 10*3/uL (ref 4.0–10.5)

## 2016-11-08 LAB — MAGNESIUM
Magnesium: 2 mg/dL (ref 1.7–2.4)
Magnesium: 2 mg/dL (ref 1.7–2.4)

## 2016-11-08 LAB — PHOSPHORUS
PHOSPHORUS: 4.3 mg/dL (ref 2.5–4.6)
Phosphorus: 4.1 mg/dL (ref 2.5–4.6)

## 2016-11-08 LAB — HIV ANTIBODY (ROUTINE TESTING W REFLEX): HIV Screen 4th Generation wRfx: NONREACTIVE

## 2016-11-08 MED ORDER — PRO-STAT SUGAR FREE PO LIQD
30.0000 mL | Freq: Two times a day (BID) | ORAL | Status: DC
  Administered 2016-11-08 – 2016-11-09 (×3): 30 mL
  Filled 2016-11-08 (×3): qty 30

## 2016-11-08 MED ORDER — DEXTROSE 50 % IV SOLN
INTRAVENOUS | Status: AC
Start: 2016-11-08 — End: 2016-11-08
  Filled 2016-11-08: qty 50

## 2016-11-08 MED ORDER — DEXTROSE 50 % IV SOLN
25.0000 mL | Freq: Once | INTRAVENOUS | Status: AC
  Administered 2016-11-08: 25 mL via INTRAVENOUS

## 2016-11-08 MED ORDER — NOREPINEPHRINE BITARTRATE 1 MG/ML IV SOLN
0.0000 ug/min | INTRAVENOUS | Status: DC
  Administered 2016-11-08: 2 ug/min via INTRAVENOUS
  Administered 2016-11-09: 3 ug/min via INTRAVENOUS
  Filled 2016-11-08 (×2): qty 16

## 2016-11-08 MED ORDER — MAGNESIUM HYDROXIDE 400 MG/5ML PO SUSP
960.0000 mL | Freq: Once | ORAL | Status: DC
  Filled 2016-11-08: qty 240

## 2016-11-08 MED ORDER — FUROSEMIDE 10 MG/ML IJ SOLN
40.0000 mg | Freq: Once | INTRAMUSCULAR | Status: AC
  Administered 2016-11-08: 40 mg via INTRAVENOUS
  Filled 2016-11-08: qty 4

## 2016-11-08 MED ORDER — POTASSIUM CHLORIDE 20 MEQ/15ML (10%) PO SOLN
40.0000 meq | Freq: Once | ORAL | Status: AC
  Administered 2016-11-08: 40 meq
  Filled 2016-11-08: qty 30

## 2016-11-08 NOTE — Consult Note (Signed)
Consultation Note Date: 11/08/2016   Patient Name: Barry [name]  DOB: September 10, 1965  MRN: [id number]  Age / Sex: 51 y.o., male  PCP: Vincente Liberty, MD Referring Physician: Rush Landmark, MD  Reason for Consultation: Establishing goals of care and Psychosocial/spiritual support  HPI/Patient Profile: 51 y.o. male   admitted on 11/05/2016 with a past medical  history of HTN, mitochondrial disease causing myopathy, recently vent dependent at [facility], sCHF, a-fib (on dig), seizure disorder.   He was admitted in September 2017 for respiratory failure at an outside facility. During that admission, he became vent dependent and had a tracheostomy.  He has had continued physical and functional decline 2/2 to chronic disease for the past several years according to his brother and SIL.  His brother/ Barry [name]/ only living family lives 3 hours away, I believe in [place].  He himself is ill, homebound and with in home caregiver.  His wife Elder Davidian helps with decisions.  They have not seen the patient since January 2018.  There is no documented HPOA but his brother Barry Ormond is next of kin and  is decision maker for his brother.  Patient continues to decline, fail to thrive in spite of aggressive life prolonging measures.  Family face advanced directive decisions and anticipatory care needs     Clinical Assessment and Goals of Care:  This NP Wadie Lessen reviewed medical records, received report from team, assessed the patient and then spoke with his brother and SIL by telephone at the patient's bedside  to discuss diagnosis, prognosis, GOC, EOL wishes disposition and options.  A detailed discussion was had today regarding advanced directives.  Concepts specific to code status, artifical feeding and hydration, continued IV antibiotics and rehospitalization was had.  The difference between a aggressive  medical intervention path  and a palliative comfort care path for this patient at this time was had.  Values and goals of care important to patient and family were attempted to be elicited.  I detailed concept of failure to thrive and limitation of medical interventions to promote meaningful recovery.  Regardless of medical interventions this patient has a poor prognosis.  Concept of Hospice and Palliative Care were discussed  Natural trajectory and expectations at EOL were discussed.  Questions and concerns addressed.   Family encouraged to call with questions or concerns.  PMT will continue to support holistically.   NEXT OF KIN    SUMMARY OF RECOMMENDATIONS    Code Status/Advance Care Planning:  Full code- recommended DNR status knowing poor outcomes in similar patients.     Palliative Prophylaxis:   Aspiration, Bowel Regimen, Delirium Protocol, Frequent Pain Assessment, Oral Care and Palliative Wound Care   Psycho-social/Spiritual:   Desire for further Chaplaincy support:yes  Additional Recommendations: Education on Hospice and Grief/Bereavement Support  Prognosis:   < 3 months, depends on desire for life prolonging measures  Discharge Planning: To Be Determined      Primary Diagnoses: Present on Admission: . Acute respiratory failure ([place])   I  have reviewed the medical record, interviewed the patient and family, and examined the patient. The following aspects are pertinent.  Past Medical History:  Diagnosis Date  . A-fib ([place])   . CHF (congestive heart failure) ([facility])    chronic systolic  . Chronic acquired lymphedema   . Mitochondrial disease ([place])   . 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 Seizures [facility])    Social History   Social History  . Marital status: Single    Spouse name: N/A  . Number of children: N/A  . Years of education: N/A   Social History Main Topics  . Smoking status: Never Smoker  . Smokeless tobacco: Never Used  . Alcohol use  None  . Drug use: Unknown  . Sexual activity: Not Asked   Other Topics Concern  . None   Social History Narrative  . None   No family history on file. Scheduled Meds: . budesonide (PULMICORT) nebulizer solution  0.5 mg Nebulization BID  . chlorhexidine gluconate (MEDLINE KIT)  15 mL Mouth Rinse BID  . Chlorhexidine Gluconate Cloth  6 each Topical Q0600  . Chlorhexidine Gluconate Cloth  6 each Topical Daily  . famotidine (PEPCID) IV  20 mg Intravenous Q12H  . FLUoxetine  20 mg Per Tube Daily  . fluticasone  1 spray Each Nare Daily  . ipratropium-albuterol  3 mL Nebulization Q6H  . lactulose  20 g Oral Q12H  . loratadine  10 mg Per Tube Daily  . mouth rinse  15 mL Mouth Rinse 10 times per day  . midodrine  10 mg Per Tube Q6H  . mupirocin ointment  1 application Nasal BID  . piperacillin-tazobactam (ZOSYN)  IV  3.375 g Intravenous Q8H  . sodium chloride flush  10-40 mL Intracatheter Q12H  . vancomycin  1,000 mg Intravenous Q24H   Continuous Infusions: . dextrose 30 mL/hr at 11/07/16 1814  . phenylephrine (NEO-SYNEPHRINE) Adult infusion 170 mcg/min (11/08/16 0600)   PRN Meds:.docusate, fentaNYL (SUBLIMAZE) injection, midazolam, ondansetron (ZOFRAN) IV, sodium chloride flush Medications Prior to Admission:  Prior to Admission medications   Medication Sig Start Date End Date Taking? Authorizing Provider  aspirin 81 MG chewable tablet Place 81 mg into feeding tube daily.   Yes Historical Provider, MD  bumetanide (BUMEX) 2 MG tablet Place 2 mg into feeding tube daily.   Yes Historical Provider, MD  calcium carbonate (TUMS - DOSED IN MG ELEMENTAL CALCIUM) 500 MG chewable tablet Chew 500 tablets by mouth daily.   Yes Historical Provider, MD  cefTRIAXone (ROCEPHIN) IVPB Inject 1 g into the vein daily. Started on 11-04-16. *stop date will be 11-07-16 ( per [facility])   Yes Historical Provider, MD  chlorhexidine (PERIDEX) 0.12 % solution Use as directed 15 mLs in the mouth or throat 2 (two) times  daily.   Yes Historical Provider, MD  digoxin (LANOXIN) 0.125 MG tablet Place 0.125 mg into feeding tube daily.   Yes Historical Provider, MD  docusate sodium (COLACE) 100 MG capsule 100 mg 2 (two) times daily.   Yes Historical Provider, MD  FLUoxetine (PROZAC) 20 MG tablet Place 20 mg into feeding tube daily.   Yes Historical Provider, MD  fluticasone (FLONASE) 50 MCG/ACT nasal spray Place 1 spray into both nostrils daily.   Yes Historical Provider, MD  insulin detemir (LEVEMIR) 100 UNIT/ML injection Inject 20 Units into the skin at bedtime.   Yes Historical Provider, MD  Lactulose 20 GM/30ML SOLN Take 30 mLs by mouth every 12 (twelve) hours.  Yes Historical Provider, MD  loratadine (CLARITIN) 10 MG tablet Place 10 mg into feeding tube daily.   Yes Historical Provider, MD  midodrine (PROAMATINE) 10 MG tablet Place 10 mg into feeding tube every 6 (six) hours.   Yes Historical Provider, MD  Multiple Vitamins-Minerals (MULTIVITAMIN WITH MINERALS) tablet Place 1 tablet into feeding tube daily.   Yes Historical Provider, MD  Pantoprazole Sodium POWD Place 40 mg into feeding tube daily.    Yes Historical Provider, MD  potassium chloride 20 MEQ/15ML (10%) SOLN Place 10 mEq into feeding tube 2 (two) times daily.   Yes Historical Provider, MD  predniSONE (DELTASONE) 20 MG tablet Place 20 mg into feeding tube daily with breakfast.   Yes Historical Provider, MD  tobramycin (NEBCIN) 10 MG/ML SOLN injection Inject 100 mg into the vein 2 times daily at 12 noon and 4 pm.   Yes Historical Provider, MD   Allergies  Allergen Reactions  . Chocolate     ON Mar- no reaction   . Iodine   . Nitroglycerin    Review of Systems  Unable to perform ROS   Physical Exam  Constitutional: He appears cachectic. He appears ill. He is intubated.  -minimally responsive to gentle stimulation   Cardiovascular: Normal rate, regular rhythm and normal heart sounds.   Pulmonary/Chest: He is intubated.  Musculoskeletal:  -  BLE muscle atrophy  Skin: Skin is warm and dry.  Noted skin breakdown per EMR    Vital Signs: BP (!) 100/55   Pulse 76   Temp 97.6 F (36.4 C) (Axillary)   Resp (!) 30   Ht _0  (1.702 m)   Wt 129.3 kg (285 lb 0.9 oz)   SpO2 100%   BMI 44.65 kg/m  Pain Assessment: CPOT       SpO2: SpO2: 100 % O2 Device:SpO2: 100 % O2 Flow Rate: .   IO: Intake/output summary:  Intake/Output Summary (Last 24 hours) at 11/08/16 0804 Last data filed at 11/08/16 0600  Gross per 24 hour  Intake          2751.19 ml  Output             1825 ml  Net           926.19 ml    LBM: Last BM Date: 11/07/16 Baseline Weight: Weight: 90.7 kg (200 lb) Most recent weight: Weight: 129.3 kg (285 lb 0.9 oz)      Palliative Assessment/Data:  30 % at best   Flowsheet Rows     Most Recent Value  Intake Tab  Referral Department  Critical care  Unit at Time of Referral  ICU  Palliative Care Primary Diagnosis  Pulmonary  Date Notified  11/07/16  Palliative Care Type  New Palliative care  Reason for referral  Clarify Goals of Care  Date of Admission  11/05/16  # of days IP prior to Palliative referral  2  Clinical Assessment  Psychosocial & Spiritual Assessment  Palliative Care Outcomes      Time In: 0830 Time Out: 0945 Time Total: 75 min Greater than 50%  of this time was spent counseling and coordinating care related to the above assessment and plan.  Signed by: Wadie Lessen, NP   Please contact Palliative Medicine Team phone at [phone] for questions and concerns.  For individual provider: See Shea Evans

## 2016-11-08 NOTE — Progress Notes (Signed)
   11/08/16 0038  Respiratory Severity Assessment  Respiratory History 0  Breath Sounds 1  Respiratory Pattern 1  Cough 2  Chest X Ray 3  O2 Requirements 1  Level of Activity 2  Dyspnea 0  Score Total 10  Aerosolized Bronchodilators  Aerosolized bronchodilator indications Aerosolized bronchodilator indicated;Bronchospasm/Wheezing  Aerosolized bronchodilator goals Bronchodilation;Relief of shortness of SOB;Reduce WOB  Plan of care Hand held neb treatment  Oxygen Therapy  Oxygen therapy indications Oxygen therapy indicated;Hypoxemia;Hypoxia;SOB/Increased WOB;Increased myocardial O2 demand  Oxygen therapy goals Treat hypoxia/hypoxemia;Treat myocardial O2 demand;Decrease WOB;Alleviate SOB  Plan of care (vent)  Respiratory Therapy Follow Up Assessment  Assessment follow up date 11/09/16  Follow-up assessment complete Yes

## 2016-11-08 NOTE — Progress Notes (Signed)
PULMONARY / CRITICAL CARE MEDICINE   Name: Barry [name] MRN: [id number] DOB: 1966/07/17    ADMISSION DATE:  11/05/2016 CONSULTATION DATE:  11/05/2016  REFERRING MD:  Dr. Rosalia Hammers  CHIEF COMPLAINT:  hypotension  HISTORY OF PRESENT ILLNESS:   Barry [name] is a 51yrs Male with history of HTN, mitochondrial disease causing myopathy, recently vent dependent at [facility], sCHF, a-fib (on dig), seizure disorder. He was admitted in September 2017 for respiratory failure at an outside facility. During that admission, he ended up being vent dependent and had a tracheostomy.  Unfortunately, I am unable to obtain history from the patient as he is encephalopathic so chart review was done. Patient's blood pressure chronically  runs low for which he is on midodrine  [facility] facility because of relative hypotension, questionable bloody stools. At the ER, blood pressure was soft. Hemoglobin was 6 from a baseline of 7.5. There was concern for GI bleed, possible sepsis so PCCM was asked to admit. He was a difficult IV access for which ED physician has tried several times and after several hours, was able to get 2 peripheral IVs on him. He ended up getting 4 L of saline and blood pressure remained 90/60, heart rate in the 60s.   SUBJECTIVE: remains on phenylephrine overnight. Also hyperglycemic started on D10   VITAL SIGNS: BP (!) 92/50 (BP Location: Right Leg)   Pulse 79   Temp 98.8 F (37.1 C) (Oral)   Resp (!) 30   Ht  (1.702 m)   Wt 129.3 kg (285 lb 0.9 oz)   SpO2 100%   BMI 44.65 kg/m   HEMODYNAMICS:    VENTILATOR SETTINGS: Vent Mode: PRVC FiO2 (%):  [30 %-40 %] 30 % Set Rate:  [30 bmp] 30 bmp Vt Set:  [400 mL] 400 mL PEEP:  [5 cmH20] 5 cmH20 Plateau Pressure:  [27 cmH20-32 cmH20] 32 cmH20  INTAKE / OUTPUT: I/O last 3 completed shifts: In: 5189.5 [I.V.:4329.5; NG/GT:260; IV Piggyback:600] Out: 2525 [Urine:2525]  PHYSICAL EXAMINATION:  Physical Exam   Constitutional: No distress.  Chronically, ill-appearing man.   HENT:  Head: Normocephalic and atraumatic.  Eyes:  Dysconjugate gaze  Neck:  Tracheosotomy in place  Cardiovascular: Normal rate and regular rhythm.   Pulmonary/Chest: Effort normal. No respiratory distress. He has no wheezes.  Abdominal: Soft. He exhibits no distension. There is no tenderness.  PEG tube in place.  Musculoskeletal: He exhibits edema (Generalized).  Neurological: He is alert.  Unable to articulate words though opens/closes eyes and lifts left arm on command.  Skin: He is not diaphoretic.     LABS:  BMET  Recent Labs Lab 11/05/16 1004 11/07/16 0513 11/08/16 0408  NA 135 141 142  K 5.4* 4.6 3.3*  CL 110 113* 116*  CO2 18* 18* 18*  BUN 28* 20 14  CREATININE 0.63 0.52* 0.54*  GLUCOSE 91 83 125*    Electrolytes  Recent Labs Lab 11/05/16 1004 11/07/16 0513 11/08/16 0408  CALCIUM 7.8* 8.9 8.6*  MG  --  2.1 2.0  PHOS  --  5.7* 4.3    CBC  Recent Labs Lab 11/05/16 2345 11/07/16 0513 11/08/16 0408  WBC 16.6* 11.9* 10.4  HGB 8.4* 9.9* 7.9*  HCT 26.7* 31.4* 25.1*  PLT 180 135* 141*    Coag's  Recent Labs Lab 11/05/16 1120  INR 1.16    Sepsis Markers  Recent Labs Lab 11/05/16 1011 11/05/16 1141  LATICACIDVEN 0.76 0.69    ABG  Recent Labs Lab 11/05/16 0955 11/05/16 2313  PHART 7.264* 7.224*  PCO2ART 44.2 47.5  PO2ART 146.0* 25.0*    Liver Enzymes  Recent Labs Lab 11/05/16 1004  AST 26  ALT 23  ALKPHOS 142*  BILITOT 1.0  ALBUMIN 1.8*    Glucose  Recent Labs Lab 11/07/16 1612 11/07/16 1723 11/07/16 2342 11/08/16 0352 11/08/16 0754 11/08/16 0818  GLUCAP 64* 84 108* 111* 61* 72    Imaging Dg Chest Port 1 View  Result Date: 11/08/2016 CLINICAL DATA:  Tracheostomy, respiratory failure EXAM: PORTABLE CHEST 1 VIEW COMPARISON:  11/07/2016 FINDINGS: Support devices are stable. Cardiomegaly with bilateral airspace disease, likely edema/ CHF. No  change since prior study. IMPRESSION: Diffuse bilateral airspace disease, likely edema/ CHF. No real change. Electronically Signed   By: Charlett Nose M.D.   On: 11/08/2016 07:27   Dg Chest Port 1 View  Result Date: 11/07/2016 CLINICAL DATA:  Status post right PICC line placement. EXAM: PORTABLE CHEST 1 VIEW COMPARISON:  Portable chest x-ray of November 06, 2016 FINDINGS: The right-sided PICC line tip projects over the junction of the middle and distal thirds of the SVC. The cardiac silhouette remains enlarged. The pulmonary vascularity remains engorged. Fluffy alveolar opacities persist bilaterally. The tracheostomy tube tip projects at the inferior margin of the clavicular heads. The esophagogastric tube tip appears to turn retrograde and lies in the gastric cardia. An external pacemaker defibrillator pad is present. IMPRESSION: No postprocedure complication following placement of the right-sided PICC line whose tip projects at the junction of the middle and distal thirds of the SVC. CHF with pulmonary interstitial and alveolar edema. Electronically Signed   By: David  [place] M.D.   On: 11/07/2016 12:08    STUDIES:  Echo 4/9 > LVEF 20-25%, diffuse HK, RSVP 75mm/Hg   CULTURES: Sputum 4/7 > Blood 4/7 >   ANTIBIOTICS: Zosyn 4/7 > Vanc 4/7 >   SIGNIFICANT EVENTS: 4/7 Admitted to ICU  LINES/TUBES: Trache 04/2016 > OSH L PICC > out R picc 4/9 >>>  DISCUSSION: Barry [name] is a 51 year old man, chronic ventilator dependent, related to mitochondrial disease, admitted from [facility] facility because of hypotension, bloody stools. There is also concern for sepsis/occult infection. He was admitted to ICU and started on broad spectrum ABX for occult sepsis and required pressors for shock/relative hypotension.  ASSESSMENT / PLAN:  Acute on chronic hypoxemic hypercapnic respiratory failure secondary to likely pulmonary edema, concern for HCAP Ventilator dependence secondary to muscular weakness from  mitochondrial disease. Probable healthcare associated pneumonia -Continue current ventilatory support. Was requiring high pressures at [facility], on typical settings here. - Continue empiric abx (day 4) -Scheduled bronchodilators -Did have good output with lasix yesterday, still positive for day, however. Will try to diurese again. Remains massively volume overloaded. -VAP bundle  Chronic hypotension on midodrine: concern for lower blood pressure than baseline likely related to sepsis, possible GI bleed Acute on chronic systolic CHF (LVEF 20-25%) Chronic a-fib - Transition phenylephrine to norepinephrine now that he has central access. Could benefit from the added inotrope  - Consider stress dose steroids if not improving - Follow CBC  GI bleeding: FOBT+ on admission, possibly from from PUD, possible contribution of proctitis. Seemingly resolved Impacted stool   -Monitor -Continue lactulose -Had BM last 2 days -Start TF per protocol  Anemia 2/2 UGI Bleed:  -Follow CBC -Transfuse for Hgb < 7  Hypoglycemia - D10 infusion at 50/Hr, should be able to decrease/DC now that we are going to feed.  Encephalopathy related to sepsis + chronic deconditioning secondary to mitochondrial disorder: Baseline unclear as he is able to follow commands though cannot understand his speech. -RASS goal: 0 to -1 -When necessary sedation as ordered   FAMILY  - Updates: no family available  - Inter-disciplinary family meet or Palliative Care meeting due by:  11/12/16   Joneen Roach, AGACNP-BC [facility] Pager [phone] or [phone]  11/08/2016 9:31 AM

## 2016-11-08 NOTE — Care Management Note (Signed)
Case Management Note  Patient Details  Name: Tarig Zimmers MRN: [id number] Date of Birth: [birth year]  Subjective/Objective:                 Patient admitted from [facility]. He  has a past medical history of A-fib [facility]); CHF (congestive heart failure) (HCC); Chronic acquired lymphedema; Mitochondrial disease (HCC); Myotonic muscular dystrophy (HCC); and Seizures (HCC). Trach.    Action/Plan:  CM will continue to follow.  Expected Discharge Date:                  Expected Discharge Plan:  Long Term Acute Care (LTAC)  In-House Referral:  Hospice / Palliative Care  Discharge planning Services  CM Consult  Post Acute Care Choice:    Choice offered to:     DME Arranged:    DME Agency:     HH Arranged:    HH Agency:     Status of Service:  In process, will continue to follow  If discussed at Long Length of Stay Meetings, dates discussed:    Additional Comments:  Lawerance Sabal, RN 11/08/2016, 3:34 PM

## 2016-11-08 NOTE — Progress Notes (Signed)
Hypoglycemic Event  CBG: 61  Treatment: D50 IV 25 mL  Symptoms: None  Follow-up CBG: Time:0818 CBG Result:72   Possible Reasons for Event: Inadequate meal intake   Abbott Pao

## 2016-11-08 NOTE — Progress Notes (Signed)
[facility] ADULT ICU REPLACEMENT PROTOCOL FOR AM LAB REPLACEMENT ONLY  The patient does not apply for the [facility] Adult ICU Electrolyte Replacment Protocol based on the criteria listed below:   1. Is GFR >/= 40 ml/min? Yes.    Patient's GFR today is >60 2. Is urine output >/= 0.5 ml/kg/hr for the last 6 hours? No. Patient's UOP is 0.4 ml/kg/hr 3. Is BUN < 60 mg/dL? Yes.    Patient's BUN today is 14 4. Abnormal electrolyte(s):K+3.3 5. Ordered repletion with:NA 6. If a panic level lab has been reported, has the CCM MD in charge been notified? Yes.  .   Physician:  Burnard Leigh 11/08/2016 5:16 AM

## 2016-11-08 NOTE — Progress Notes (Signed)
eLink Physician-Brief Progress Note Patient Name: Barry [name] DOB: 10-12-1965 MRN: [id number]   Date of Service  11/08/2016  HPI/Events of Note  K 3.3  eICU Interventions  Repleted     Intervention Category Minor Interventions: Electrolytes abnormality - evaluation and management  Madie Cahn 11/08/2016, 5:33 AM

## 2016-11-09 DIAGNOSIS — A419 Sepsis, unspecified organism: Secondary | ICD-10-CM | POA: Diagnosis not present

## 2016-11-09 DIAGNOSIS — Z515 Encounter for palliative care: Secondary | ICD-10-CM

## 2016-11-09 DIAGNOSIS — J9611 Chronic respiratory failure with hypoxia: Secondary | ICD-10-CM

## 2016-11-09 DIAGNOSIS — Z7189 Other specified counseling: Secondary | ICD-10-CM | POA: Diagnosis not present

## 2016-11-09 DIAGNOSIS — R627 Adult failure to thrive: Secondary | ICD-10-CM

## 2016-11-09 DIAGNOSIS — J9600 Acute respiratory failure, unspecified whether with hypoxia or hypercapnia: Secondary | ICD-10-CM | POA: Diagnosis not present

## 2016-11-09 LAB — BPAM RBC
BLOOD PRODUCT EXPIRATION DATE: 201805012359
BLOOD PRODUCT EXPIRATION DATE: 201805012359
BLOOD PRODUCT EXPIRATION DATE: 201805012359
Blood Product Expiration Date: 201805022359
ISSUE DATE / TIME: 201804080446
ISSUE DATE / TIME: 201804071837
UNIT TYPE AND RH: 5100
UNIT TYPE AND RH: 5100
Unit Type and Rh: 5100
Unit Type and Rh: 5100

## 2016-11-09 LAB — BASIC METABOLIC PANEL
Anion gap: 7 (ref 5–15)
BUN: 16 mg/dL (ref 6–20)
CO2: 19 mmol/L — ABNORMAL LOW (ref 22–32)
Calcium: 8.8 mg/dL — ABNORMAL LOW (ref 8.9–10.3)
Chloride: 113 mmol/L — ABNORMAL HIGH (ref 101–111)
Creatinine, Ser: 0.62 mg/dL (ref 0.61–1.24)
GFR calc Af Amer: >60 mL/min (ref >60)
GFR calc non Af Amer: >60 mL/min (ref >60)
Glucose, Bld: 147 mg/dL — ABNORMAL HIGH (ref 65–99)
Potassium: 3.8 mmol/L (ref 3.5–5.1)
Sodium: 139 mmol/L (ref 135–145)

## 2016-11-09 LAB — GLUCOSE, CAPILLARY
GLUCOSE-CAPILLARY: 114 mg/dL — AB (ref 65–99)
Glucose-Capillary: 127 mg/dL — ABNORMAL HIGH (ref 65–99)
Glucose-Capillary: 122 mg/dL — ABNORMAL HIGH (ref 65–99)
Glucose-Capillary: 120 mg/dL — ABNORMAL HIGH (ref 65–99)
Glucose-Capillary: 130 mg/dL — ABNORMAL HIGH (ref 65–99)
Glucose-Capillary: 161 mg/dL — ABNORMAL HIGH (ref 65–99)

## 2016-11-09 LAB — TYPE AND SCREEN
ABO/RH(D): A POS
Antibody Screen: NEGATIVE
PT AG TYPE: POSITIVE
UNIT DIVISION: 0
UNIT DIVISION: 0
UNIT DIVISION: 0
Unit division: 0

## 2016-11-09 LAB — CULTURE, RESPIRATORY

## 2016-11-09 LAB — CBC
HCT: 27.1 % — ABNORMAL LOW (ref 39.0–52.0)
Hemoglobin: 8.4 g/dL — ABNORMAL LOW (ref 13.0–17.0)
MCH: 25.8 pg — AB (ref 26.0–34.0)
MCHC: 31 g/dL (ref 30.0–36.0)
MCV: 83.4 fL (ref 78.0–100.0)
PLATELETS: 113 10*3/uL — AB (ref 150–400)
RBC: 3.25 MIL/uL — ABNORMAL LOW (ref 4.22–5.81)
RDW: 20.8 % — AB (ref 11.5–15.5)
WBC: 9.2 10*3/uL (ref 4.0–10.5)

## 2016-11-09 LAB — VANCOMYCIN, TROUGH: Vancomycin Tr: 28 ug/mL (ref 15–20)

## 2016-11-09 LAB — MAGNESIUM
MAGNESIUM: 1.9 mg/dL (ref 1.7–2.4)
Magnesium: 2 mg/dL (ref 1.7–2.4)

## 2016-11-09 LAB — CULTURE, RESPIRATORY W GRAM STAIN: Special Requests: NORMAL

## 2016-11-09 LAB — PHOSPHORUS
Phosphorus: 4.2 mg/dL (ref 2.5–4.6)
Phosphorus: 4 mg/dL (ref 2.5–4.6)

## 2016-11-09 MED ORDER — METHYLPREDNISOLONE SODIUM SUCC 125 MG IJ SOLR
60.0000 mg | Freq: Three times a day (TID) | INTRAMUSCULAR | Status: DC
  Administered 2016-11-09 – 2016-11-10 (×4): 60 mg via INTRAVENOUS
  Filled 2016-11-09 (×4): qty 2

## 2016-11-09 MED ORDER — VITAL HIGH PROTEIN PO LIQD
1000.0000 mL | ORAL | Status: DC
  Administered 2016-11-09 – 2016-11-13 (×5): 1000 mL

## 2016-11-09 MED ORDER — DEXTROSE 5 % IV SOLN
1.0000 g | Freq: Three times a day (TID) | INTRAVENOUS | Status: DC
  Administered 2016-11-09 – 2016-11-13 (×12): 1 g via INTRAVENOUS
  Filled 2016-11-09 (×13): qty 1

## 2016-11-09 NOTE — Progress Notes (Signed)
eLink Physician-Brief Progress Note Patient Name: Barry [name] DOB: 06/15/1966 MRN: [id number]   Date of Service  11/09/2016  HPI/Events of Note  rn asking rectal tube as some loose stols but pt had GI bleed and possible diferntial per note pccm for proctitis Will avoid for now  eICU Interventions       Intervention Category Intermediate Interventions: Communication with other healthcare providers and/or family  Nelda Bucks. 11/09/2016, 10:51 PM

## 2016-11-09 NOTE — Progress Notes (Signed)
Nutrition Consult/Follow Up  DOCUMENTATION CODES:   Obesity unspecified  INTERVENTION:    Initiate Vital High Protein at 20 ml/hr and increase by 10 ml every 8 hours to goal rate of 70 ml/hr  TF regimen to provide 1680 kcals, 147 gm protein, 1411 ml of water daily  NUTRITION DIAGNOSIS:   Inadequate oral intake related to inability to eat as evidenced by NPO status, ongoing   GOAL:   Provide needs based on ASPEN/SCCM guidelines, currently unmet  MONITOR:   TF tolerance, Weight trends, Labs, I & O's, Skin, Vent status  ASSESSMENT:   51 year old Male with history of HTN, mitochondrial disease causing myopathy, recently vent dependent at [facility], sCHF, a-fib (on dig), seizure disorder. He was admitted in September 2017 for respiratory failure at an outside facility. During that admission, he ended up being vent dependent and had a tracheostomy.   Patient is currently on ventilator support >> trach Temp (24hrs), Avg:97.1 F (36.2 C), Min:95.8 F (35.4 C), Max:97.9 F (36.6 C)  NGT to LIS  Admitted from [facility] due to relative hypotension and questionable bloody stools.  TF regimen PTA was Osmolite 1.5 formula at 65 ml/hr via PEG tube. Palliative Care Team following for goals of care. Labs and medications reviewed. CBG's [id number].  Diet Order:  Diet NPO time specified  Skin:  Wound (see comment) (Stage II PI on buttocks)  Last BM:  4/11  Height:   Ht Readings from Last 1 Encounters:  11/05/16  (1.702 m)   Weight:   Wt Readings from Last 1 Encounters:  11/09/16 281 lb 8.4 oz (127.7 kg)   Ideal Body Weight:  67.3 kg  BMI:  Body mass index is 44.09 kg/m.  Estimated Nutritional Needs:   Kcal:  1610-9604  Protein:  >/= 135 gm  Fluid:  per MD  EDUCATION NEEDS:   No education needs identified at this time  Maureen Chatters, RD, LDN Pager #: [phone] After-Hours Pager #: [phone]

## 2016-11-09 NOTE — Progress Notes (Signed)
Notified via lab of critical vanc trough of 28. Pharmacy notified. Will continue to monitor.

## 2016-11-09 NOTE — Progress Notes (Addendum)
PULMONARY / CRITICAL CARE MEDICINE   Name: Barry [name] MRN: [id number] DOB: [birth year]    ADMISSION DATE:  11/05/2016 CONSULTATION DATE:  11/05/2016  REFERRING MD:  Dr. Rosalia [name]  CHIEF COMPLAINT:  hypotension  HISTORY OF PRESENT ILLNESS:   Barry [name] is a 69yrs Male with history of HTN, mitochondrial disease causing myopathy, recently vent dependent at [facility], sCHF, a-fib (on dig), seizure disorder. He was admitted in September 2017 for respiratory failure at an outside facility. During that admission, he ended up being vent dependent and had a tracheostomy.  Unfortunately, I am unable to obtain history from the patient as he is encephalopathic so chart review was done. Patient's blood pressure chronically  runs low for which he is on midodrine  [facility] facility because of relative hypotension, questionable bloody stools. At the ER, blood pressure was soft. Hemoglobin was 6 from a baseline of 7.5. There was concern for GI bleed, possible sepsis so PCCM was asked to admit. He was a difficult IV access for which ED physician has tried several times and after several hours, was able to get 2 peripheral IVs on him. He ended up getting 4 L of saline and blood pressure remained 90/60, heart rate in the 60s.   SUBJECTIVE: sedated, NAD.    VITAL SIGNS: BP 115/65   Pulse 90   Temp (!) 96.4 F (35.8 C) (Axillary) Comment: warming blanket applied  Resp (!) 30   Ht  (1.702 m)   Wt 281 lb 8.4 oz (127.7 kg)   SpO2 93%   BMI 44.09 kg/m   HEMODYNAMICS:    VENTILATOR SETTINGS: Vent Mode: PRVC FiO2 (%):  [30 %] 30 % Set Rate:  [30 bmp] 30 bmp Vt Set:  [400 mL] 400 mL PEEP:  [5 cmH20] 5 cmH20 Plateau Pressure:  [24 cmH20-34 cmH20] 26 cmH20  INTAKE / OUTPUT: I/O last 3 completed shifts: In: 3185.4 [I.V.:2395.4; Other:60; NG/GT:180; IV Piggyback:550] Out: 2625 [Urine:2625]  PHYSICAL EXAMINATION:  Physical Exam  Constitutional: No distress.   Chronically, ill-appearing man.   HENT:  Head: Normocephalic and atraumatic.  Neck:  Tracheosotomy in place  Cardiovascular: Normal rate and regular rhythm.  Exam reveals no gallop and no friction rub.   No murmur heard. Pulmonary/Chest: Effort normal. No respiratory distress. He has no wheezes.  Abdominal: Soft. He exhibits no distension. There is no tenderness.  PEG tube in place.  Musculoskeletal: He exhibits edema (Generalized).  Neurological: He is alert.  Unable to articulate words though opens/closes eyes and lifts left arm on command.  Skin: He is not diaphoretic.     LABS:  BMET  Recent Labs Lab 11/07/16 0513 11/08/16 0408 11/09/16 0346  NA 141 142 139  K 4.6 3.3* 3.8  CL 113* 116* 113*  CO2 18* 18* 19*  BUN CREATININE 0.52* 0.54* 0.62  GLUCOSE 83 125* 147*    Electrolytes  Recent Labs Lab 11/07/16 0513 11/08/16 0408 11/08/16 1120 11/09/16 0346  CALCIUM 8.9 8.6*  --  8.8*  MG 2.1 2.0 2.0 2.0  PHOS 5.7* 4.3 4.1 4.2    CBC  Recent Labs Lab 11/07/16 0513 11/08/16 0408 11/09/16 0346  WBC 11.9* 10.4 9.2  HGB 9.9* 7.9* 8.4*  HCT 31.4* 25.1* 27.1*  PLT 135* 141* 113*    Coag's  Recent Labs Lab 11/05/16 1120  INR 1.16    Sepsis Markers  Recent Labs Lab 11/05/16 1011 11/05/16 1141  LATICACIDVEN 0.76 0.69  ABG  Recent Labs Lab 11/05/16 0955 11/05/16 2313  PHART 7.264* 7.224*  PCO2ART 44.2 47.5  PO2ART 146.0* 25.0*    Liver Enzymes  Recent Labs Lab 11/05/16 1004  AST 26  ALT 23  ALKPHOS 142*  BILITOT 1.0  ALBUMIN 1.8*    Glucose  Recent Labs Lab 11/08/16 1210 11/08/16 1604 11/08/16 1941 11/08/16 2338 11/09/16 0350 11/09/16 0750  GLUCAP 80 133* 148* 149* 127* 122*    Imaging No results found.  STUDIES:  Echo 4/9 > LVEF 20-25%, diffuse HK, RSVP 23mm/Hg   CULTURES: Sputum 4/7 >pseduomonas Blood 4/7 >   ANTIBIOTICS: Zosyn 4/7 >4/11 Vanc 4/7 > 4/11 Ceftaz 4/11 >  SIGNIFICANT  EVENTS: 4/7 Admitted to ICU  LINES/TUBES: Trache 04/2016 > OSH L PICC > out R picc 4/9 >>>  DISCUSSION: Mr. Barry [name] is a 51 year old man, chronic ventilator dependent, related to mitochondrial disease, admitted from [facility] facility because of hypotension, bloody stools. There is also concern for sepsis/occult infection. He was admitted to ICU and started on broad spectrum ABX for occult sepsis and required pressors for shock/relative hypotension.  ASSESSMENT / PLAN:  Acute on chronic hypoxemic hypercapnic respiratory failure secondary to likely pulmonary edema, concern for HCAP Ventilator dependence secondary to muscular weakness from mitochondrial disease. Probable healthcare associated pneumonia -Continue current ventilatory support. Was requiring high pressures at [facility], on typical settings here. - respiratory culture growing pseudomonas sensitive to cefepime, ceftazidime, cipro, genta, imipenem, and zosyn. Will narrow to ceftazidime.  -Scheduled bronchodilators - still +8L up since admission, has received lasix  IV daily for the past 2 days w/ good UOP. Will hold off on diuresis as having issues with BP. -VAP bundle  Chronic hypotension on midodrine: concern for lower blood pressure than baseline likely related to sepsis, possible GI bleed Acute on chronic systolic CHF (LVEF 20-25%) Chronic a-fib - continue levophed  - start stress dose steroids - solumedrol  q8h, home meds indicate he was on  daily - Follow CBC  GI bleeding: FOBT+ on admission, possibly from from PUD, possible contribution of proctitis. Seemingly resolved Impacted stool   -Monitor -Continue lactulose -TF per protocol  Anemia 2/2 UGI Bleed:  -Follow CBC -Transfuse for Hgb < 7  Hypoglycemia - D10 infusion at 50/Hr - nutrition consulted for tube feeds  Encephalopathy related to sepsis + chronic deconditioning secondary to mitochondrial disorder: Baseline unclear as he is able to follow  commands though cannot understand his speech. -RASS goal: 0 to -1 -When necessary sedation as ordered   FAMILY  - Updates: no family available - palliative care following, appreciate recommendations, trying to arrange family meeting  - Inter-disciplinary family meet or Palliative Care meeting due by:  11/12/16  Gara Kroner, MD Internal Medicine Resident, PGY [facility] Internal Medicine Program Pager: [phone]    11/09/2016 9:59 AM   Attending note: I have seen and examined the patient with nurse practitioner/resident and agree with the note. History, labs and imaging reviewed.  No acute issues overnight. Continues to be vent dependent  Blood pressure (!) 107/55, pulse 63, temperature (!) 95.8 F (35.4 C), temperature source Axillary, resp. rate 20, height  (1.702 m), weight 281 lb 8.4 oz (127.7 kg), SpO2 100 %. Gen:      No acute distress HEENT:  EOMI, sclera anicteric Neck:     No masses; no thyromegaly Lungs:    Clear to auscultation bilaterally; normal respiratory effort CV:         Regular rate and rhythm;  no murmurs Abd:      + bowel sounds; soft, non-tender; no palpable masses, no distension Ext:    No edema; adequate peripheral perfusion Skin:      Warm and dry; no rash Neuro: Sedated, unresponsive Chest x-ray 11/08/16-bilateral interstitial opacities concerning for pulmonary edema I have reviewed the images personally  Lab significant for hemoglobin 8.4, platelets 113  Assessment/plan: Acute and chronic respiratory failure Pseudomonas pneumonia Ventilator dependence secondary to mitochondrial myopathy  -Continue vent support -Narrow antibiotics to ceftaz to cover Pseudomonas -Hold lasix today given low blood pressures -Start stress dose steroids -Palliative care meeting pending to clarify goals of care.  The patient is critically ill with multiple organ systems failure and requires high complexity decision making for assessment and support,  frequent evaluation and titration of therapies, application of advanced monitoring technologies and extensive interpretation of multiple databases.  Critical care time - 35 mins. This represents my time independent of the residents  time taking care of the pt.  Chilton Greathouse MD [facility] Pager [phone] If no answer or after 3pm call: [phone] 11/09/2016, 2:02 PM

## 2016-11-09 NOTE — Progress Notes (Signed)
Patient ID: Oreoluwa Aigner, male   DOB: [birth year], 51 y.o.   MRN: [id number]  This NP visited patient at the bedside as a follow up to  yesterday's GOCs meeting.   Spoke again by telephone with brother/Kenny regarding current medical situation    I expressed my concern for poor prognosis and recommended a  DNR status knowing poor outcomes in like patients.  Questions and concerns addressed.    Bernette Redbird reiterates desire for all offered and avail be medical interventions to prolong life.  Discussed the importance of continued conversation with  medical providers regarding overall plan of care and treatment options,  ensuring decisions are within the context of the patients values and GOCs.   Discussed with Dr Christene Slates  Time in     0800    Time out    0825  Total time spend on the unit 25 minutes  Greater than 50% of the time was spent in counseling and coordination of care  Lorinda Creed NP  Palliative Medicine Team Team Phone # [phone] Pager [phone]

## 2016-11-09 NOTE — Progress Notes (Signed)
Pharmacy Antibiotic Note  Barry [name] is a 51 y.o. male admitted on 11/05/2016 with sepsis.  Pharmacy had been consulted for vancomycin and zosyn dosing. Patient presents from [facility] and was on ceftriaxone for UTI. Noted that patient has history of muscular dystrophy and had been therapeutic on vancomycin  q24h. Will increase dose slightly secondary to new sepsis. 4/11 VT 28 - slightly higher than goal, Tc hypothermic, wbc wnl, TA pseudomonas - pan sensitive, OSH Ucx Ecoli  - narrow abx coverage   Plan: Stop vancomycin and zosyn Start Fortaz 1gm IV q8hr  Monitor culture data, renal function and clinical course   Height:  (170.2 cm) Weight: 281 lb 8.4 oz (127.7 kg) IBW/kg (Calculated) : 66.1  Temp (24hrs), Avg:97 F (36.1 C), Min:95.8 F (35.4 C), Max:97.9 F (36.6 C)   Recent Labs Lab 11/05/16 1004 11/05/16 1011 11/05/16 1141 11/05/16 2345 11/07/16 0513 11/08/16 0408 11/09/16 0346 11/09/16 1045  WBC 15.8*  --   --  16.6* 11.9* 10.4 9.2  --   CREATININE 0.63  --   --   --  0.52* 0.54* 0.62  --   LATICACIDVEN  --  0.76 0.69  --   --   --   --   --   VANCOTROUGH  --   --   --   --   --   --   --  28*    Estimated Creatinine Clearance: 141.7 mL/min (by C-G formula based on SCr of 0.62 mg/dL).    Allergies  Allergen Reactions  . Chocolate     ON Mar- no reaction   . Iodine   . Nitroglycerin      Leota Sauers Pharm.D. CPP, BCPS Clinical Pharmacist [phone] 11/09/2016 12:22 PM

## 2016-11-10 ENCOUNTER — Encounter (HOSPITAL_COMMUNITY): Payer: Self-pay | Admitting: Cardiology

## 2016-11-10 ENCOUNTER — Other Ambulatory Visit: Payer: Self-pay

## 2016-11-10 ENCOUNTER — Inpatient Hospital Stay (HOSPITAL_COMMUNITY): Payer: Medicare Other

## 2016-11-10 DIAGNOSIS — J9601 Acute respiratory failure with hypoxia: Secondary | ICD-10-CM | POA: Diagnosis not present

## 2016-11-10 DIAGNOSIS — J9600 Acute respiratory failure, unspecified whether with hypoxia or hypercapnia: Secondary | ICD-10-CM | POA: Diagnosis not present

## 2016-11-10 DIAGNOSIS — I5043 Acute on chronic combined systolic (congestive) and diastolic (congestive) heart failure: Secondary | ICD-10-CM

## 2016-11-10 DIAGNOSIS — A419 Sepsis, unspecified organism: Secondary | ICD-10-CM | POA: Diagnosis not present

## 2016-11-10 LAB — COOXEMETRY PANEL
Carboxyhemoglobin: 2.4 % — ABNORMAL HIGH (ref 0.5–1.5)
METHEMOGLOBIN: 0.5 % (ref 0.0–1.5)
O2 SAT: 72.2 %
TOTAL HEMOGLOBIN: 7.9 g/dL — AB (ref 12.0–16.0)

## 2016-11-10 LAB — GLUCOSE, CAPILLARY
GLUCOSE-CAPILLARY: 163 mg/dL — AB (ref 65–99)
GLUCOSE-CAPILLARY: 163 mg/dL — AB (ref 65–99)
GLUCOSE-CAPILLARY: 141 mg/dL — AB (ref 65–99)
GLUCOSE-CAPILLARY: 143 mg/dL — AB (ref 65–99)
GLUCOSE-CAPILLARY: 161 mg/dL — AB (ref 65–99)
Glucose-Capillary: 171 mg/dL — ABNORMAL HIGH (ref 65–99)

## 2016-11-10 LAB — MAGNESIUM
Magnesium: 2 mg/dL (ref 1.7–2.4)
Magnesium: 1.9 mg/dL (ref 1.7–2.4)

## 2016-11-10 LAB — CULTURE, BLOOD (ROUTINE X 2)
CULTURE: NO GROWTH
Culture: NO GROWTH
SPECIAL REQUESTS: ADEQUATE
Special Requests: ADEQUATE

## 2016-11-10 LAB — BASIC METABOLIC PANEL
ANION GAP: 9 (ref 5–15)
BUN: 15 mg/dL (ref 6–20)
CHLORIDE: 113 mmol/L — AB (ref 101–111)
CO2: 20 mmol/L — ABNORMAL LOW (ref 22–32)
Calcium: 9.2 mg/dL (ref 8.9–10.3)
Creatinine, Ser: 0.56 mg/dL — ABNORMAL LOW (ref 0.61–1.24)
GFR calc Af Amer: >60 mL/min (ref >60)
GFR calc non Af Amer: >60 mL/min (ref >60)
Glucose, Bld: 195 mg/dL — ABNORMAL HIGH (ref 65–99)
POTASSIUM: 4 mmol/L (ref 3.5–5.1)
SODIUM: 142 mmol/L (ref 135–145)

## 2016-11-10 LAB — CBC
HEMATOCRIT: 26.2 % — AB (ref 39.0–52.0)
HEMOGLOBIN: 8.3 g/dL — AB (ref 13.0–17.0)
MCH: 26.3 pg (ref 26.0–34.0)
MCHC: 31.7 g/dL (ref 30.0–36.0)
MCV: 82.9 fL (ref 78.0–100.0)
Platelets: 131 10*3/uL — ABNORMAL LOW (ref 150–400)
RBC: 3.16 MIL/uL — ABNORMAL LOW (ref 4.22–5.81)
RDW: 21.1 % — AB (ref 11.5–15.5)
WBC: 5.5 10*3/uL (ref 4.0–10.5)

## 2016-11-10 LAB — PHOSPHORUS
PHOSPHORUS: 3.7 mg/dL (ref 2.5–4.6)
Phosphorus: 4.1 mg/dL (ref 2.5–4.6)

## 2016-11-10 LAB — TROPONIN I: Troponin I: <0.03 ng/mL (ref <0.03)

## 2016-11-10 MED ORDER — LACTULOSE 10 GM/15ML PO SOLN: 20.0000 g | Freq: Two times a day (BID) | ORAL | Status: DC

## 2016-11-10 MED ORDER — HYDROCORTISONE NA SUCCINATE PF 100 MG IJ SOLR
100.0000 mg | Freq: Three times a day (TID) | INTRAMUSCULAR | Status: DC
  Administered 2016-11-10 – 2016-11-13 (×8): 100 mg via INTRAVENOUS
  Filled 2016-11-10 (×8): qty 2

## 2016-11-10 MED ORDER — ACETAMINOPHEN 160 MG/5ML PO SOLN
650.0000 mg | Freq: Four times a day (QID) | ORAL | Status: DC | PRN
  Administered 2016-11-11 – 2016-11-13 (×4): 650 mg
  Filled 2016-11-10 (×4): qty 20.3

## 2016-11-10 MED ORDER — FUROSEMIDE 10 MG/ML IJ SOLN
20.0000 mg | Freq: Two times a day (BID) | INTRAMUSCULAR | Status: DC
  Administered 2016-11-10: 20 mg via INTRAVENOUS
  Filled 2016-11-10: qty 2

## 2016-11-10 MED ORDER — HYDROCERIN EX CREA
TOPICAL_CREAM | Freq: Two times a day (BID) | CUTANEOUS | Status: DC
  Administered 2016-11-10 – 2016-11-11 (×2): 1 via TOPICAL
  Administered 2016-11-11: 11:00:00 via TOPICAL
  Administered 2016-11-12: 1 via TOPICAL
  Administered 2016-11-12 – 2016-11-13 (×2): via TOPICAL
  Filled 2016-11-10 (×2): qty 113

## 2016-11-10 MED ORDER — FUROSEMIDE 10 MG/ML IJ SOLN
40.0000 mg | Freq: Three times a day (TID) | INTRAMUSCULAR | Status: DC
  Administered 2016-11-10 – 2016-11-13 (×10): 40 mg via INTRAVENOUS
  Filled 2016-11-10 (×10): qty 4

## 2016-11-10 NOTE — Progress Notes (Signed)
PULMONARY / CRITICAL CARE MEDICINE   Name: Barry [name] MRN: [id number] DOB: 1966-05-22    ADMISSION DATE:  11/05/2016 CONSULTATION DATE:  11/05/2016  REFERRING MD:  Dr. Rosalia Hammers  CHIEF COMPLAINT:  hypotension  HISTORY OF PRESENT ILLNESS:   Barry [name] is a 38yrs Male with history of HTN, mitochondrial disease causing myopathy, recently vent dependent at [facility], sCHF, a-fib (on dig), seizure disorder. He was admitted in September 2017 for respiratory failure at an outside facility. During that admission, he ended up being vent dependent and had a tracheostomy.  Unfortunately, I am unable to obtain history from the patient as he is encephalopathic so chart review was done. Patient's blood pressure chronically  runs low for which he is on midodrine  [facility] because of relative hypotension, questionable bloody stools. At the ER, blood pressure was soft. Hemoglobin was 6 from a baseline of 7.5. There was concern for GI bleed, possible sepsis so PCCM was asked to admit. He was a difficult IV access for which ED physician has tried several times and after several hours, was able to get 2 peripheral IVs on him. He ended up getting 4 L of saline and blood pressure remained 90/60, heart rate in the 60s.   SUBJECTIVE: sedated, NAD.    VITAL SIGNS: BP 112/62 (BP Location: Left Arm)   Pulse 60   Temp 98.1 F (36.7 C) (Oral)   Resp (!) 30   Ht  (1.702 m)   Wt 280 lb 13.9 oz (127.4 kg)   SpO2 99%   BMI 43.99 kg/m   HEMODYNAMICS:    VENTILATOR SETTINGS: Vent Mode: PRVC FiO2 (%):  [30 %] 30 % Set Rate:  [30 bmp] 30 bmp Vt Set:  [400 mL] 400 mL PEEP:  [5 cmH20] 5 cmH20 Plateau Pressure:  [28 cmH20-30 cmH20] 28 cmH20  INTAKE / OUTPUT: I/O last 3 completed shifts: In: 1803.2 [I.V.:1193.2; Other:20; NG/GT:340; IV Piggyback:250] Out: 3970 [Urine:3520; Emesis/NG output:450]  PHYSICAL EXAMINATION:  Physical Exam  Constitutional: No distress.   Chronically, ill-appearing man.   HENT:  Head: Normocephalic and atraumatic.  Neck:  Tracheosotomy in place  Cardiovascular: Normal rate and regular rhythm.  Exam reveals no gallop and no friction rub.   No murmur heard. Pulmonary/Chest: Effort normal. No respiratory distress. He has no wheezes.  Abdominal: Soft. He exhibits no distension. There is no tenderness.  PEG tube in place.  Musculoskeletal: He exhibits edema (Generalized).  Neurological: He is alert.  Opens eyes to voice and will turn face   Skin: Skin is dry. He is not diaphoretic.  2+ pedal edema, dry skin with thick scales b/l     LABS:  BMET  Recent Labs Lab 11/08/16 0408 11/09/16 0346 11/10/16 0415  NA 142 139 142  K 3.3* 3.8 4.0  CL 116* 113* 113*  CO2 18* 19* 20*  BUN CREATININE 0.54* 0.62 0.56*  GLUCOSE 125* 147* 195*    Electrolytes  Recent Labs Lab 11/08/16 0408  11/09/16 0346 11/09/16 1700 11/10/16 0415  CALCIUM 8.6*  --  8.8*  --  9.2  MG 2.0  < > 2.0 1.9 2.0  PHOS 4.3  < > 4.2 4.0 4.1  < > = values in this interval not displayed.  CBC  Recent Labs Lab 11/08/16 0408 11/09/16 0346 11/10/16 0415  WBC 10.4 9.2 5.5  HGB 7.9* 8.4* 8.3*  HCT 25.1* 27.1* 26.2*  PLT 141* 113* 131*  Coag's  Recent Labs Lab 11/05/16 1120  INR 1.16    Sepsis Markers  Recent Labs Lab 11/05/16 1011 11/05/16 1141  LATICACIDVEN 0.76 0.69    ABG  Recent Labs Lab 11/05/16 0955 11/05/16 2313  PHART 7.264* 7.224*  PCO2ART 44.2 47.5  PO2ART 146.0* 25.0*    Liver Enzymes  Recent Labs Lab 11/05/16 1004  AST 26  ALT 23  ALKPHOS 142*  BILITOT 1.0  ALBUMIN 1.8*    Glucose  Recent Labs Lab 11/09/16 1150 11/09/16 1540 11/09/16 1925 11/09/16 2326 11/10/16 0339 11/10/16 0737  GLUCAP 114* 120* 130* 161* 163* 163*    Imaging Dg Chest Port 1 View  Result Date: 11/10/2016 CLINICAL DATA:  Respiratory failure. EXAM: PORTABLE CHEST 1 VIEW COMPARISON:  Two days ago  FINDINGS: Tracheostomy tube with inflated balloon which likely distends the trachea. An orogastric tube reaches the stomach at least. Right upper extremity PICC with tip at the upper cavoatrial junction. Low volume chest with diffuse hazy density, greatest at the bases. Cardiomegaly. IMPRESSION: 1. Stable positioning of tubes and central line. The tracheostomy cuff likely over distends the trachea. 2. Stable low volume chest with atelectasis or pneumonia. Degree of superimposed venous congestion or edema. Electronically Signed   By: Marnee Spring M.D.   On: 11/10/2016 07:58    STUDIES:  Echo 4/9 > LVEF 20-25%, diffuse HK, RSVP 55mm/Hg CXR 4/12 > Stable low volume chest with atelectasis or pneumonia. Degree of superimposed venous congestion or edema.  CULTURES: Sputum 4/7 >pseduomonas Blood 4/7 >   ANTIBIOTICS: Zosyn 4/7 >4/11 Vanc 4/7 > 4/11 Ceftaz 4/11 >  SIGNIFICANT EVENTS: 4/7 Admitted to ICU  LINES/TUBES: Trache 04/2016 > OSH L PICC > out R picc 4/9 >>>  DISCUSSION: Barry [name] is a 51 year old man, chronic ventilator dependent, related to mitochondrial disease, admitted from [facility] facility because of hypotension, bloody stools. There is also concern for sepsis/occult infection. He was admitted to ICU and started on broad spectrum ABX for occult sepsis and required pressors for shock/relative hypotension.  ASSESSMENT / PLAN:  Acute on chronic hypoxemic hypercapnic respiratory failure secondary to likely pulmonary edema, concern for HCAP Ventilator dependence secondary to muscular weakness from mitochondrial disease. Probable healthcare associated pneumonia -Continue current ventilatory support. Was requiring high pressures at [facility], on typical settings here. -continue ceftaz -Scheduled bronchodilators - lasix  IV BID -VAP bundle  Chronic hypotension on midodrine: concern for lower blood pressure than baseline likely related to sepsis, possible GI bleed Acute on  chronic systolic CHF (LVEF 20-25%) Chronic a-fib - wean levo for MAP > 60 - solumedrol  q8h started 4/11 - Follow CBC  GI bleeding: FOBT+ on admission, possibly from from PUD, possible contribution of proctitis. Seemingly resolved -Monitor -Continue lactulose -TF per protocol  Anemia 2/2 UGI Bleed:  -Follow CBC -Transfuse for Hgb < 7  Encephalopathy related to sepsis + chronic deconditioning secondary to mitochondrial disorder: Baseline unclear as he is able to follow commands though cannot understand his speech. -RASS goal: 0 to -1 -When necessary sedation as ordered   FAMILY  - Updates: no family available - palliative care following, appreciate recommendations, trying to arrange family meeting  - Inter-disciplinary family meet or Palliative Care meeting due by:  11/12/16  Gara Kroner, MD Internal Medicine Resident, PGY [facility] Internal Medicine Program Pager: [phone]    11/10/2016 8:48 AM

## 2016-11-10 NOTE — Progress Notes (Signed)
Responded to patients HR SB in 40's. While at bedside noted patient to have missed beats and pauses. EKG obtained. MD Mannam at bedside, troponin ordered. Will continue to monitor.

## 2016-11-10 NOTE — Care Management Note (Signed)
Case Management Note Previous Note by Lawerance Sabal 11/08/16  Patient Details  Name: Fitzgerald Dunne MRN: [id number] Date of Birth: 15-Aug-1965  Subjective/Objective:                 Patient admitted from [facility]. He  has a past medical history of A-fib [facility]); CHF (congestive heart failure) (HCC); Chronic acquired lymphedema; Mitochondrial disease (HCC); Myotonic muscular dystrophy (HCC); and Seizures (HCC). Trach.    Action/Plan:  CM will continue to follow.  Expected Discharge Date:                  Expected Discharge Plan:  Long Term Acute Care (LTAC)  In-House Referral:  Hospice / Palliative Care  Discharge planning Services  CM Consult  Post Acute Care Choice:    Choice offered to:     DME Arranged:    DME Agency:     HH Arranged:    HH Agency:     Status of Service:  In process, will continue to follow  If discussed at Long Length of Stay Meetings, dates discussed:    Additional Comments: 11/10/2016 Discussed in LOS 11/10/16 - pt remains appropriate for continued stay.  CM confirmed that pt is from [facility].  Palliative meeting tentative for today - will follow up post. Cherylann Parr, RN 11/10/2016, 9:23 AM

## 2016-11-10 NOTE — Consult Note (Signed)
Reason for Consult: decreased EF and sinus brady    Referring Physician: Dr. Corrie Dandy    PCP:  Leola Brazil, MD  Primary Cardiologist: new  Dr. [name] is an 51 y.o. male.    Chief Complaint: pt admitted 11/05/16 with hypotension, intermittent with chronic Vent with triple lumen PICC and foley from facility.     HPI:  Barry [name] is a 51 y.o. male who is being seen today for the evaluation of decreased EF and bradycardiac in atrial fib at the request of Dr. Murlean Iba of [facility].    Pt has a history of HTN, mitochondrial disease causing myopathy, recently vent dependent at [facility], sCHF, a-fib (on dig), seizure disorder, previous head injury with SDH with craniotomy. He has hx of removal of pericardial fluid and pericardial sac in the past.  He was admitted in September 2017 for respiratory failure at an outside facility-[facility].  During that admission, he ended up being vent dependent and had a tracheostomy.  On admit unable to answer any questions- due to encephalopathy, ( a note in chart stated he communicated by tapping his fingers) and now only reaches for suction, at times may move.   ( hx of chronic hypotension on midodrine)  He has chronic a fib back to at least 2012 per EKGs  and chronic systolic HF.  He is not on anticoagulation with CHA2DS2VASC score of 2.   Additionally with anmia.  04/08/16 pt with echo and EF 57% ,  Mild concentric LVH, mild TR RV systolic pressure is probably normal. Small size pericardial effusion  <1 cm.   EKG from that time a fib rate 65-100 with incomplete LBBB.  ECHO 05/13/13 with EF severely decreased EF 25030% with septal, basal anteroseptal, mid anteroseptal and inf wall akinesis.    On arrival he was hypotensive and possible blood stools and hgb was 6 down from his baseline of 7.5 has now rec'd 2 units pRBCs..   + pulmonary edema.  Concern for sepsis as well.  He was bradycardic during nights and hypotensive, dopamine  added.  IV resuscitation with 4 L. Also with hypoglycemia.   Also sacral ulcer, UTI and HCAP.    EKG on admit a fib with rate of 87, intraventricular conduction delay LBBB.  EKG today with a fib bradycardic to 53 and now T wave inversion in II, V4-5 new.    Cr 0.56. BUN 15  Stool heme +  Now with new echo changes 11/07/16 pt's echo with EF 20-25%, diffuse hypokinesis, septal motion with paradox,  Mild MR, mod. TR, PA pk pressure was 42 mmHg, no pericardial effusion but large left pleural effusion.  Moderately decreased RVEF. Old records from 2015 note a cardiomyopathy in 2014 with EF 15-20% Troponin is neg <0.03 today.   Pt's a fib is rate controlled though at times to 50s and 30s.    Currently on Vent, norepinephrine drip, NG feeding.    His I&O + 6,606 down from peak of + 9,232.  Wt on admit 200 lbs now 280 lbs.  He is lasix 20 mg BID, also on solumedrol, midodrine.  No longer on lanoxin with bradycardia.   Palliative care has been consulted, pt's next of kin a brother is also ill and with caregivers.  Pt is currently full code   Past Medical History:  Diagnosis Date  . A-fib ([place])   . CHF (congestive heart failure) ([place])  chronic systolic  . Chronic acquired lymphedema   . Mitochondrial disease ([place])   . Myotonic muscular dystrophy ([place])   . Seizures ([place])     Past Surgical History:  Procedure Laterality Date  . CRANIOTOMY    . pericardial sac removed      Family History  Problem Relation Age of Onset  . Hypertension Mother    Social History:  reports that he has never smoked. He has never used smokeless tobacco. His alcohol and drug histories are not on file.  Allergies:  Allergies  Allergen Reactions  . Chocolate     ON Mar- no reaction   . Iodine   . Nitroglycerin     OUTPATIENT MEDICATIONS: Asa 81 mg , bumex 2 mg daily, tums, rocephin , peridex sol., lanoxin 0.125 mg daily, colzce, levemir 20 units at bedtime, MVI, protonix , KDur 20 meq , prednisone 20 mg.   Tobramycin 100 mg, prozac, flonase, lactulose 20 gm, claritin, midodrine.    CURRENT MEDICATIONS: Scheduled Meds: . budesonide (PULMICORT) nebulizer solution  0.5 mg Nebulization BID  . cefTAZidime (FORTAZ)  IV  1 g Intravenous Q8H  . chlorhexidine gluconate (MEDLINE KIT)  15 mL Mouth Rinse BID  . Chlorhexidine Gluconate Cloth  6 each Topical Q0600  . Chlorhexidine Gluconate Cloth  6 each Topical Daily  . famotidine (PEPCID) IV  20 mg Intravenous Q12H  . FLUoxetine  20 mg Per Tube Daily  . fluticasone  1 spray Each Nare Daily  . furosemide  20 mg Intravenous BID  . hydrocerin   Topical BID  . ipratropium-albuterol  3 mL Nebulization Q6H  . lactulose  20 g Oral Q12H  . loratadine  10 mg Per Tube Daily  . mouth rinse  15 mL Mouth Rinse 10 times per day  . methylPREDNISolone (SOLU-MEDROL) injection  60 mg Intravenous Q8H  . midodrine  10 mg Per Tube Q6H  . mupirocin ointment  1 application Nasal BID  . sodium chloride flush  10-40 mL Intracatheter Q12H   Continuous Infusions: . dextrose Stopped (11/09/16 1700)  . feeding supplement (VITAL HIGH PROTEIN) 1,000 mL (11/09/16 1711)  . norepinephrine (LEVOPHED) Adult infusion Stopped (11/10/16 1500)   PRN Meds:.docusate, fentaNYL (SUBLIMAZE) injection, midazolam, ondansetron (ZOFRAN) IV, sodium chloride flush   Results for orders placed or performed during the hospital encounter of 11/05/16 (from the past 48 hour(s))  Glucose, capillary     Status: Abnormal   Collection Time: 11/08/16  4:04 PM  Result Value Ref Range   Glucose-Capillary 133 (H) 65 - 99 mg/dL   Comment 1 Capillary Specimen    Comment 2 Notify RN   Glucose, capillary     Status: Abnormal   Collection Time: 11/08/16  7:41 PM  Result Value Ref Range   Glucose-Capillary 148 (H) 65 - 99 mg/dL   Comment 1 Capillary Specimen    Comment 2 Notify RN    Comment 3 Document in Chart   Glucose, capillary     Status: Abnormal   Collection Time: 11/08/16 11:38 PM  Result Value  Ref Range   Glucose-Capillary 149 (H) 65 - 99 mg/dL   Comment 1 Capillary Specimen    Comment 2 Notify RN    Comment 3 Document in Chart   Basic metabolic panel     Status: Abnormal   Collection Time: 11/09/16  3:46 AM  Result Value Ref Range   Sodium 139 135 - 145 mmol/L   Potassium 3.8 3.5 - 5.1 mmol/L  Chloride 113 (H) 101 - 111 mmol/L   CO2 19 (L) 22 - 32 mmol/L   Glucose, Bld 147 (H) 65 - 99 mg/dL   BUN 16 6 - 20 mg/dL   Creatinine, Ser 0.62 0.61 - 1.24 mg/dL   Calcium 8.8 (L) 8.9 - 10.3 mg/dL   GFR calc non Af Amer >60 >60 mL/min   GFR calc Af Amer >60 >60 mL/min    Comment: (NOTE) The eGFR has been calculated using the CKD EPI equation. This calculation has not been validated in all clinical situations. eGFR's persistently <60 mL/min signify possible Chronic Kidney Disease.    Anion gap 7 5 - 15  CBC     Status: Abnormal   Collection Time: 11/09/16  3:46 AM  Result Value Ref Range   WBC 9.2 4.0 - 10.5 K/uL   RBC 3.25 (L) 4.22 - 5.81 MIL/uL   Hemoglobin 8.4 (L) 13.0 - 17.0 g/dL   HCT 27.1 (L) 39.0 - 52.0 %   MCV 83.4 78.0 - 100.0 fL   MCH 25.8 (L) 26.0 - 34.0 pg   MCHC 31.0 30.0 - 36.0 g/dL   RDW 20.8 (H) 11.5 - 15.5 %   Platelets 113 (L) 150 - 400 K/uL    Comment: PLATELET COUNT CONFIRMED BY SMEAR  Magnesium     Status: None   Collection Time: 11/09/16  3:46 AM  Result Value Ref Range   Magnesium 2.0 1.7 - 2.4 mg/dL  Phosphorus     Status: None   Collection Time: 11/09/16  3:46 AM  Result Value Ref Range   Phosphorus 4.2 2.5 - 4.6 mg/dL  Glucose, capillary     Status: Abnormal   Collection Time: 11/09/16  3:50 AM  Result Value Ref Range   Glucose-Capillary 127 (H) 65 - 99 mg/dL   Comment 1 Capillary Specimen    Comment 2 Notify RN    Comment 3 Document in Chart   Glucose, capillary     Status: Abnormal   Collection Time: 11/09/16  7:50 AM  Result Value Ref Range   Glucose-Capillary 122 (H) 65 - 99 mg/dL   Comment 1 Capillary Specimen    Comment 2  Notify RN   Vancomycin, trough     Status: Abnormal   Collection Time: 11/09/16 10:45 AM  Result Value Ref Range   Vancomycin Tr 28 (HH) 15 - 20 ug/mL    Comment: CRITICAL RESULT CALLED TO, READ BACK BY AND VERIFIED WITH: B.ROCK,RN 1138 11/09/16 CLARK,S   Glucose, capillary     Status: Abnormal   Collection Time: 11/09/16 11:50 AM  Result Value Ref Range   Glucose-Capillary 114 (H) 65 - 99 mg/dL   Comment 1 Capillary Specimen    Comment 2 Notify RN   Glucose, capillary     Status: Abnormal   Collection Time: 11/09/16  3:40 PM  Result Value Ref Range   Glucose-Capillary 120 (H) 65 - 99 mg/dL   Comment 1 Capillary Specimen    Comment 2 Notify RN   Magnesium     Status: None   Collection Time: 11/09/16  5:00 PM  Result Value Ref Range   Magnesium 1.9 1.7 - 2.4 mg/dL  Phosphorus     Status: None   Collection Time: 11/09/16  5:00 PM  Result Value Ref Range   Phosphorus 4.0 2.5 - 4.6 mg/dL  Glucose, capillary     Status: Abnormal   Collection Time: 11/09/16  7:25 PM  Result Value Ref Range  Glucose-Capillary 130 (H) 65 - 99 mg/dL   Comment 1 Capillary Specimen    Comment 2 Notify RN    Comment 3 Document in Chart   Glucose, capillary     Status: Abnormal   Collection Time: 11/09/16 11:26 PM  Result Value Ref Range   Glucose-Capillary 161 (H) 65 - 99 mg/dL   Comment 1 Capillary Specimen    Comment 2 Notify RN    Comment 3 Document in Chart   Glucose, capillary     Status: Abnormal   Collection Time: 11/10/16  3:39 AM  Result Value Ref Range   Glucose-Capillary 163 (H) 65 - 99 mg/dL   Comment 1 Capillary Specimen    Comment 2 Notify RN    Comment 3 Document in Chart   Magnesium     Status: None   Collection Time: 11/10/16  4:15 AM  Result Value Ref Range   Magnesium 2.0 1.7 - 2.4 mg/dL  Phosphorus     Status: None   Collection Time: 11/10/16  4:15 AM  Result Value Ref Range   Phosphorus 4.1 2.5 - 4.6 mg/dL  Basic metabolic panel     Status: Abnormal   Collection  Time: 11/10/16  4:15 AM  Result Value Ref Range   Sodium 142 135 - 145 mmol/L   Potassium 4.0 3.5 - 5.1 mmol/L   Chloride 113 (H) 101 - 111 mmol/L   CO2 20 (L) 22 - 32 mmol/L   Glucose, Bld 195 (H) 65 - 99 mg/dL   BUN 15 6 - 20 mg/dL   Creatinine, Ser 0.56 (L) 0.61 - 1.24 mg/dL   Calcium 9.2 8.9 - 10.3 mg/dL   GFR calc non Af Amer >60 >60 mL/min   GFR calc Af Amer >60 >60 mL/min    Comment: (NOTE) The eGFR has been calculated using the CKD EPI equation. This calculation has not been validated in all clinical situations. eGFR's persistently <60 mL/min signify possible Chronic Kidney Disease.    Anion gap 9 5 - 15  CBC     Status: Abnormal   Collection Time: 11/10/16  4:15 AM  Result Value Ref Range   WBC 5.5 4.0 - 10.5 K/uL   RBC 3.16 (L) 4.22 - 5.81 MIL/uL   Hemoglobin 8.3 (L) 13.0 - 17.0 g/dL   HCT 26.2 (L) 39.0 - 52.0 %   MCV 82.9 78.0 - 100.0 fL   MCH 26.3 26.0 - 34.0 pg   MCHC 31.7 30.0 - 36.0 g/dL   RDW 21.1 (H) 11.5 - 15.5 %   Platelets 131 (L) 150 - 400 K/uL  Glucose, capillary     Status: Abnormal   Collection Time: 11/10/16  7:37 AM  Result Value Ref Range   Glucose-Capillary 163 (H) 65 - 99 mg/dL   Comment 1 Capillary Specimen   Glucose, capillary     Status: Abnormal   Collection Time: 11/10/16 11:13 AM  Result Value Ref Range   Glucose-Capillary 141 (H) 65 - 99 mg/dL   Comment 1 Capillary Specimen   Troponin I     Status: None   Collection Time: 11/10/16  1:02 PM  Result Value Ref Range   Troponin I <0.03 <0.03 ng/mL   Dg Chest Port 1 View  Result Date: 11/10/2016 CLINICAL DATA:  Respiratory failure. EXAM: PORTABLE CHEST 1 VIEW COMPARISON:  Two days ago FINDINGS: Tracheostomy tube with inflated balloon which likely distends the trachea. An orogastric tube reaches the stomach at least. Right upper extremity PICC with  tip at the upper cavoatrial junction. Low volume chest with diffuse hazy density, greatest at the bases. Cardiomegaly. IMPRESSION: 1. Stable  positioning of tubes and central line. The tracheostomy cuff likely over distends the trachea. 2. Stable low volume chest with atelectasis or pneumonia. Degree of superimposed venous congestion or edema. Electronically Signed   By: Monte Fantasia M.D.   On: 11/10/2016 07:58    ROS: General:no colds or fevers, + weight gain here Skin:no rashes per nurse, + sacral ulcer HEENT:no blurred vision, no congestion- apparent pt cannot answer CV:see HPI PUL:see HPI GI:no diarrhea constipation or melena, no indigestion that we know pt could not answer GU:no hematuria, no dysuria seen pt cannot answer MS:no joint pain, no claudication- that we are aware- pt cannot answer Neuro:no syncope, no lightheadedness pt cannot answer Endo:+ diabetes, no thyroid disease   Blood pressure (!) 106/58, pulse (!) 50, temperature 97.6 F (36.4 C), temperature source Oral, resp. rate (!) 30, height 5' 7"  (1.702 m), weight 280 lb 13.9 oz (127.4 kg), SpO2 98 %.  Wt Readings from Last 3 Encounters:  11/10/16 280 lb 13.9 oz (127.4 kg)    PE: General: NAD resting only responds by opening eyes when Lt arm moved for me- not sure he understands my explanation Skin:Warm to cool and dry, brisk capillary refill HEENT:normocephalic, sclera clear, mucus membranes moist Neck:supple, no JVD,trach in place, pt on vent Heart:S1S2 RRR very muffled without murmur, gallup, rub or click Lungs:diminished ant without obvious rales, rhonchi, or wheezes NOI:BBCWU, somewhat tense, non tender, + BS, do not palpate liver spleen or masses, + pitting edema Ext:3-4+  lower ext edema up into back and abd, unable to palpate pedal pulses, SCDs are in place,  2+ radial pulses Neuro: wakes when rt arm moved, follows commands, + facial symmetry   Assessment/Plan Active Problems:   Acute respiratory failure (HCC)   Gastrointestinal hemorrhage   Pressure injury of skin   Chronic respiratory failure with hypoxia ([place])   DNR (do not resuscitate)  discussion   Palliative care by specialist   Adult failure to thrive  Atrial Fib, chronic since at least 2012, now with varying rates to 38 at lowest but briefly, up to 80s other times. Dig has been stopped -CHA2DS2VASc of 2 but has not been on anticoagulation may be due to chronic anemia  Cardiomyopathy  ? New troponin is < 0.03  He has had EFs at this level in 2014 but it had improved in Sept to 57%. Will get carboxy hgb , check CVPs- Dr. Cathie Olden has seen and will review Echo as well.   Plan to diuresis and if need add milrinone.    Acute systolic HF with volume overload see above.   Anemia now with hgb at 8.3- admitted at 6 transfused 2 uPRBCs, plts 131  Edema  3-4+  Increase lasix to 40 IV every 8 hours Cr is stable.    DM followed by [facility]  Hx hypotension on midodrine - continues this admit, norepinephrine just stopped by MAP down to 60 so resuming.    Dr. Acie Fredrickson has seen note to follow  Cecilie Kicks  Nurse Practitioner Certified [place] Pager [phone] or after 5pm or weekends call [phone] 11/10/2016, 3:25 PM    Attending Note:   The patient was seen and examined.  Agree with assessment and plan as noted above.  Changes made to the above note as needed.  Patient seen and independently examined with Sedalia Muta, NP.  We discussed all aspects of the encounter. I agree with the assessment and plan as stated above.  1. Acute on chronic combined CHF:  Due to mitochondrial cardiomyopathy .   He is massively volume overloaded.   He has edema of his legs and abdominal wall.  Large pleural effusion.  He has been receiving IV fluids for his hypotension He is only on low dose lasix.   Has needed intermittent Levophed for BP support.  I have personally reviewed the echo.   He has severe LV dysfunction with EF of 15% by my estimate.   Will get a Co-ox from his PICC line His CVP is 16.  Will increase lasix to 40 mg IV Q 8 hr.   If we can get his BP  higher, will start Milrinone to help with diuresis.  We have restarted the Levophed to help with BP support  Unfortunately he is not a candidate for advanced therapies such as VAD given his underlying medical issues ( encephalopathy, vent dependence)   His prognosis is poor.       I have spent a total of 50 minutes with patient reviewing hospital  notes , telemetry, EKGs, labs and examining patient as well as establishing an assessment and plan that was discussed with the patient. > 50% of time was spent in direct patient care.    Thayer Headings, Brooke Bonito., MD, [facility] 11/10/2016, 4:03 PM [address],  [place] Pager [phone]

## 2016-11-10 NOTE — Consult Note (Signed)
WOC Nurse wound consult note Reason for Consult: LE wounds Wound type: no open wounds, patient with bilateral LE edema secondary most likely to lymphedema with brawny skin changes. Stage 2 pressure injury, currently treated by the skin care order set implemented by the bedside nursing staff. Pressure Injury POA: Yes WOC did not assess PI today.  Patient needed to be suctioned at the time of my visit.  Dressing procedure/placement/frequency: Patient is not able to tell me, he is on the vent if he uses any type of lymphedema treatments (compression or pumps).  He is from a LTAC.  LEs are intact, no open wounds.  Will add emollient for venous skin changes.   Discussed POC with bedside nurse.  Re consult if needed, will not follow at this time. Thanks  Myangel Summons [employer], RN,CWOCN, CNS [phone])

## 2016-11-11 DIAGNOSIS — A419 Sepsis, unspecified organism: Secondary | ICD-10-CM | POA: Diagnosis not present

## 2016-11-11 DIAGNOSIS — I5043 Acute on chronic combined systolic (congestive) and diastolic (congestive) heart failure: Secondary | ICD-10-CM | POA: Diagnosis not present

## 2016-11-11 DIAGNOSIS — J9600 Acute respiratory failure, unspecified whether with hypoxia or hypercapnia: Secondary | ICD-10-CM | POA: Diagnosis not present

## 2016-11-11 LAB — CBC
HCT: 26.8 % — ABNORMAL LOW (ref 39.0–52.0)
Hemoglobin: 8.5 g/dL — ABNORMAL LOW (ref 13.0–17.0)
MCH: 26.5 pg (ref 26.0–34.0)
MCHC: 31.7 g/dL (ref 30.0–36.0)
MCV: 83.5 fL (ref 78.0–100.0)
PLATELETS: 135 10*3/uL — AB (ref 150–400)
RBC: 3.21 MIL/uL — AB (ref 4.22–5.81)
RDW: 21.2 % — AB (ref 11.5–15.5)
WBC: 4.7 10*3/uL (ref 4.0–10.5)

## 2016-11-11 LAB — GLUCOSE, CAPILLARY
GLUCOSE-CAPILLARY: 174 mg/dL — AB (ref 65–99)
GLUCOSE-CAPILLARY: 171 mg/dL — AB (ref 65–99)
Glucose-Capillary: 146 mg/dL — ABNORMAL HIGH (ref 65–99)
Glucose-Capillary: 167 mg/dL — ABNORMAL HIGH (ref 65–99)
Glucose-Capillary: 168 mg/dL — ABNORMAL HIGH (ref 65–99)
Glucose-Capillary: 169 mg/dL — ABNORMAL HIGH (ref 65–99)

## 2016-11-11 LAB — BASIC METABOLIC PANEL
Anion gap: 10 (ref 5–15)
BUN: 19 mg/dL (ref 6–20)
CHLORIDE: 109 mmol/L (ref 101–111)
CO2: 23 mmol/L (ref 22–32)
CREATININE: 0.51 mg/dL — AB (ref 0.61–1.24)
Calcium: 8.7 mg/dL — ABNORMAL LOW (ref 8.9–10.3)
GFR calc non Af Amer: >60 mL/min (ref >60)
Glucose, Bld: 175 mg/dL — ABNORMAL HIGH (ref 65–99)
POTASSIUM: 3 mmol/L — AB (ref 3.5–5.1)
SODIUM: 142 mmol/L (ref 135–145)

## 2016-11-11 LAB — COOXEMETRY PANEL
CARBOXYHEMOGLOBIN: 1.7 % — AB (ref 0.5–1.5)
Methemoglobin: 1.1 % (ref 0.0–1.5)
O2 SAT: 74.5 %
TOTAL HEMOGLOBIN: 8.6 g/dL — AB (ref 12.0–16.0)

## 2016-11-11 MED ORDER — CHLORHEXIDINE GLUCONATE CLOTH 2 % EX PADS
6.0000 | MEDICATED_PAD | Freq: Every day | CUTANEOUS | Status: DC
  Administered 2016-11-12 – 2016-11-13 (×2): 6 via TOPICAL

## 2016-11-11 MED ORDER — LACTULOSE 10 GM/15ML PO SOLN: 20.0000 g | Freq: Every day | ORAL | Status: DC

## 2016-11-11 MED ORDER — POTASSIUM CHLORIDE 20 MEQ/15ML (10%) PO SOLN
40.0000 meq | ORAL | Status: AC
  Administered 2016-11-11 (×2): 40 meq
  Filled 2016-11-11 (×2): qty 30

## 2016-11-11 MED ORDER — SODIUM CHLORIDE 0.9 % IV SOLN
INTRAVENOUS | Status: DC | PRN
  Administered 2016-11-11: 15:00:00 via INTRAVENOUS

## 2016-11-11 NOTE — Progress Notes (Signed)
eLink Physician-Brief Progress Note Patient Name: Barry [name] DOB: Mar 28, 1966 MRN: [id number]   Date of Service  11/11/2016  HPI/Events of Note  Hypokalemia  eICU Interventions  Potassium replaced     Intervention Category Intermediate Interventions: Electrolyte abnormality - evaluation and management  DETERDING,ELIZABETH 11/11/2016, 5:43 AM

## 2016-11-11 NOTE — Progress Notes (Signed)
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   Is requiring low dose Levophed for BP support Is now beginning to diurese on Lasix Q 8 hr.   Inpatient Medications    Scheduled Meds: . budesonide (PULMICORT) nebulizer solution  0.5 mg Nebulization BID  . cefTAZidime (FORTAZ)  IV  1 g Intravenous Q8H  . chlorhexidine gluconate (MEDLINE KIT)  15 mL Mouth Rinse BID  . Chlorhexidine Gluconate Cloth  6 each Topical Daily  . famotidine (PEPCID) IV  20 mg Intravenous Q12H  . FLUoxetine  20 mg Per Tube Daily  . fluticasone  1 spray Each Nare Daily  . furosemide  40 mg Intravenous Q8H  . hydrocerin   Topical BID  . hydrocortisone sod succinate (SOLU-CORTEF) inj  100 mg Intravenous Q8H  . ipratropium-albuterol  3 mL Nebulization Q6H  . lactulose  20 g Per Tube Q12H  . loratadine  10 mg Per Tube Daily  . mouth rinse  15 mL Mouth Rinse 10 times per day  . midodrine  10 mg Per Tube Q6H  . potassium chloride  40 mEq Per Tube Q4H  . sodium chloride flush  10-40 mL Intracatheter Q12H   Continuous Infusions: . dextrose Stopped (11/09/16 1700)  . feeding supplement (VITAL HIGH PROTEIN) 1,000 mL (11/11/16 0605)  . norepinephrine (LEVOPHED) Adult infusion 1 mcg/min (11/11/16 0102)   PRN Meds: acetaminophen (TYLENOL) oral liquid 160 mg/5 mL, docusate, fentaNYL (SUBLIMAZE) injection, midazolam, ondansetron (ZOFRAN) IV, sodium chloride flush   Vital Signs    Vitals:   11/11/16 0422 11/11/16 0500 11/11/16 0600 11/11/16 0700  BP:  (!) 122/56 140/72 125/71  Pulse:  65 80 (!) 32  Resp:  (!) 30 (!) 25 (!) 27  Temp:      TempSrc:      SpO2:  97% 100% 99%  Weight: 275 lb 5.7 oz (124.9 kg)     Height:        Intake/Output Summary (Last 24 hours) at 11/11/16 0751 Last data filed at 11/11/16 0700  Gross per 24 hour  Intake           2118.15 ml  Output             3700 ml  Net         -1581.85 ml   Filed Weights   11/09/16 0500 11/10/16 0700 11/11/16 0422  Weight: 281 lb 8.4 oz (127.7 kg) 280 lb 13.9 oz (127.4 kg) 275 lb 5.7 oz (124.9 kg)    Telemetry    Atrial fib with controlled V rate.  - Personally Reviewed  ECG    Atrial fib  - Personally Reviewed  Physical Exam   GEN: trach collar, on the vent , unresponsive Neck: very thick neck , difficult to assess JVP Cardiac: Irreg. irreg.  Distant heart sounds  Respiratory: vent sounds  GI: obese,  Abdominal wall edema  MS: 3-4+ pitting past his thighs up to abdomen Neuro:  unresponseive,  Moves his head on occasion   Psych: unable to assess   Labs    Chemistry Recent Labs Lab 11/05/16 1004  11/09/16 0346 11/10/16 0415 11/11/16 0415  NA 135  < > 139 142 142  K 5.4*  < > 3.8 4.0 3.0*  CL 110  < > 113* 113* 109  CO2 18*  < > 19*  20* 23  GLUCOSE 91  < > 147* 195* 175*  BUN 28*  < > 16 15 19   CREATININE 0.63  < > 0.62 0.56* 0.51*  CALCIUM 7.8*  < > 8.8* 9.2 8.7*  PROT 6.4*  --   --   --   --   ALBUMIN 1.8*  --   --   --   --   AST 26  --   --   --   --   ALT 23  --   --   --   --   ALKPHOS 142*  --   --   --   --   BILITOT 1.0  --   --   --   --   GFRNONAA >60  < > >60 >60 >60  GFRAA >60  < > >60 >60 >60  ANIONGAP 7  < > 7 9 10   < > = values in this interval not displayed.   Hematology Recent Labs Lab 11/09/16 0346 11/10/16 0415 11/11/16 0415  WBC 9.2 5.5 4.7  RBC 3.25* 3.16* 3.21*  HGB 8.4* 8.3* 8.5*  HCT 27.1* 26.2* 26.8*  MCV 83.4 82.9 83.5  MCH 25.8* 26.3 26.5  MCHC 31.0 31.7 31.7  RDW 20.8* 21.1* 21.2*  PLT 113* 131* 135*    Cardiac Enzymes Recent Labs Lab 11/10/16 1302  TROPONINI <0.03   No results for input(s): TROPIPOC in the last 168 hours.   BNP Recent Labs Lab 11/05/16 1819  BNP 176.2*     DDimer No results for input(s): DDIMER in the last 168 hours.   Radiology    Dg Chest Port 1 View  Result  Date: 11/10/2016 CLINICAL DATA:  Respiratory failure. EXAM: PORTABLE CHEST 1 VIEW COMPARISON:  Two days ago FINDINGS: Tracheostomy tube with inflated balloon which likely distends the trachea. An orogastric tube reaches the stomach at least. Right upper extremity PICC with tip at the upper cavoatrial junction. Low volume chest with diffuse hazy density, greatest at the bases. Cardiomegaly. IMPRESSION: 1. Stable positioning of tubes and central line. The tracheostomy cuff likely over distends the trachea. 2. Stable low volume chest with atelectasis or pneumonia. Degree of superimposed venous congestion or edema. Electronically Signed   By: Monte Fantasia M.D.   On: 11/10/2016 07:58    Cardiac Studies     Patient Profile     51 y.o. male with acute on chronic CHF  Assessment & Plan    1. Acute on chronic combined CHF:   Is massively volume overloaded.   Co-ox is 74 Will continue aggressive diuresis . Continue Levophed for BP support  He is not a candidate for advanced CHF devices or therapies. His prognosis is poor .    Signed, Mertie Moores, MD  11/11/2016, 7:51 AM

## 2016-11-11 NOTE — Progress Notes (Signed)
PULMONARY / CRITICAL CARE MEDICINE   Name: Barry [name] MRN: [id number] DOB: [birth year]    ADMISSION DATE:  11/05/2016 CONSULTATION DATE:  11/05/2016  REFERRING MD:  Dr. Rosalia Hammers  CHIEF COMPLAINT:  hypotension  HISTORY OF PRESENT ILLNESS:   Barry [name] is a 51yrs Male with history of HTN, mitochondrial disease causing myopathy, recently vent dependent at [facility], sCHF, a-fib (on dig), seizure disorder. He was admitted in September 2017 for respiratory failure at an outside facility. During that admission, he ended up being vent dependent and had a tracheostomy.  Unfortunately, I am unable to obtain history from the patient as he is encephalopathic so chart review was done. Patient's blood pressure chronically  runs low for which he is on midodrine  [facility] because of relative hypotension, questionable bloody stools. At the ER, blood pressure was soft. Hemoglobin was 6 from a baseline of 7.5. There was concern for GI bleed, possible sepsis so PCCM was asked to admit. He was a difficult IV access for which ED physician has tried several times and after several hours, was able to get 2 peripheral IVs on him. He ended up getting 4 L of saline and blood pressure remained 90/60, heart rate in the 60s.   SUBJECTIVE: NAD, on vent    VITAL SIGNS: BP 125/71   Pulse (!) 32   Temp 97.8 F (36.6 C) (Oral)   Resp (!) 27   Ht  (1.702 m)   Wt 275 lb 5.7 oz (124.9 kg)   SpO2 99%   BMI 43.13 kg/m   HEMODYNAMICS: CVP:  [16 mmHg-21 mmHg] 16 mmHg  VENTILATOR SETTINGS: Vent Mode: PRVC FiO2 (%):  [30 %] 30 % Set Rate:  [30 bmp] 30 bmp Vt Set:  [400 mL] 400 mL PEEP:  [5 cmH20] 5 cmH20 Plateau Pressure:  [28 cmH20-36 cmH20] 30 cmH20  INTAKE / OUTPUT: I/O last 3 completed shifts: In: 2228.8 [I.V.:58.8; Other:230; NG/GT:1540; IV Piggyback:400] Out: 5150 [Urine:5150]  PHYSICAL EXAMINATION:  Physical Exam  Constitutional: No distress.  Chronically,  ill-appearing man.   HENT:  Head: Normocephalic and atraumatic.  Neck:  Tracheosotomy in place  Cardiovascular: Normal rate and regular rhythm.  Exam reveals no gallop and no friction rub.   No murmur heard. Pulmonary/Chest: Effort normal. No respiratory distress. He has no wheezes.  Abdominal: Soft. He exhibits no distension. There is no tenderness.  PEG tube in place.  Musculoskeletal: He exhibits edema (Generalized).  Neurological: He is alert.  Opens eyes to voice and will turn face   Skin: Skin is dry. He is not diaphoretic.  2+ pedal edema, dry skin with thick scales b/l     LABS:  BMET  Recent Labs Lab 11/09/16 0346 11/10/16 0415 11/11/16 0415  NA 139 142 142  K 3.8 4.0 3.0*  CL 113* 113* 109  CO2 19* 20* 23  BUN CREATININE 0.62 0.56* 0.51*  GLUCOSE 147* 195* 175*    Electrolytes  Recent Labs Lab 11/09/16 0346 11/09/16 1700 11/10/16 0415 11/10/16 1600 11/11/16 0415  CALCIUM 8.8*  --  9.2  --  8.7*  MG 2.0 1.9 2.0 1.9  --   PHOS 4.2 4.0 4.1 3.7  --     CBC  Recent Labs Lab 11/09/16 0346 11/10/16 0415 11/11/16 0415  WBC 9.2 5.5 4.7  HGB 8.4* 8.3* 8.5*  HCT 27.1* 26.2* 26.8*  PLT 113* 131* 135*    Coag's  Recent Labs Lab  11/05/16 1120  INR 1.16    Sepsis Markers  Recent Labs Lab 11/05/16 1011 11/05/16 1141  LATICACIDVEN 0.76 0.69    ABG  Recent Labs Lab 11/05/16 0955 11/05/16 2313  PHART 7.264* 7.224*  PCO2ART 44.2 47.5  PO2ART 146.0* 25.0*    Liver Enzymes  Recent Labs Lab 11/05/16 1004  AST 26  ALT 23  ALKPHOS 142*  BILITOT 1.0  ALBUMIN 1.8*    Glucose  Recent Labs Lab 11/10/16 1113 11/10/16 1638 11/10/16 1944 11/10/16 2331 11/11/16 0344 11/11/16 0751  GLUCAP 141* 143* 161* 171* 174* 169*    Imaging No results found.  STUDIES:  Echo 4/9 > LVEF 20-25%, diffuse HK, RSVP 58mm/Hg CXR 4/12 > Stable low volume chest with atelectasis or pneumonia. Degree of superimposed venous congestion  or edema.  CULTURES: Sputum 4/7 >pseduomonas Blood 4/7 > negative, final  ANTIBIOTICS: Zosyn 4/7 >4/11 Vanc 4/7 > 4/11 Ceftaz 4/11 >  SIGNIFICANT EVENTS: 4/7 Admitted to ICU  LINES/TUBES: Trache 04/2016 > OSH L PICC > out R picc 4/9 >>>  DISCUSSION: Mr. Kohles is a 51 year old man, chronic ventilator dependent, related to mitochondrial disease, admitted [facility] because of hypotension, bloody stools. There is also concern for sepsis/occult infection. He was admitted to ICU and started on broad spectrum ABX for occult sepsis and required pressors for shock/relative hypotension.  ASSESSMENT / PLAN:  Acute on chronic hypoxemic hypercapnic respiratory failure secondary to likely pulmonary edema, concern for HCAP Ventilator dependence secondary to muscular weakness from mitochondrial disease. Probable healthcare associated pneumonia -Continue current ventilatory support.  -continue ceftaz -Scheduled bronchodilators - diuretics per cards - VAP bundle  Chronic hypotension on midodrine: concern for lower blood pressure than baseline likely related to sepsis, possible GI bleed - continue midodrine - wean levo for MAP > 60  Acute on chronic systolic CHF (LVEF 20-25%)  Chronic a-fib - solumedrol  q8h started 4/11 - Follow CBC - cardiology following, Co Ox 74, continuing aggressive diuresis w/ lasix  IV q8h - K 3.0, repleted  GI bleeding: FOBT+ on admission, possibly from from PUD, possible contribution of proctitis. No reports of bloody BMs for past 3 days -Monitor -stopped lactulose -TF per protocol  Anemia 2/2 UGI Bleed:  -Follow CBC -Transfuse for Hgb < 7  Encephalopathy related to sepsis + chronic deconditioning secondary to mitochondrial disorder: Baseline unclear as he is able to follow commands though cannot understand his speech. -RASS goal: 0 to -1 -When necessary sedation as ordered   FAMILY  - Updates: no family available - palliative  care following, appreciate recommendations, trying to arrange family meeting  - Inter-disciplinary family meet or Palliative Care meeting due by:  11/12/16  Gara Kroner, MD Internal Medicine Resident, PGY [facility] Internal Medicine Program Pager: [phone]    11/11/2016 8:09 AM

## 2016-11-11 NOTE — Discharge Summary (Signed)
Patient ID: Barry [name] MRN: [id number] DOB/AGE: 50-Apr-1967 50 y.o.  Admit date: 11/05/2016 Discharge date: 11/13/2016   Discharge Diagnoses:  1. Acute on Chronic respiratory failure 2. Pseduomonas PNA 3. 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 Volume overload secondary to reduced EF  Detailed Hospital Course:  Rigdon Macomber Hyland is a 62yrs Male with history of HTN, mitochondrial disease causing myopathy, recently vent dependent at [facility], sCHF, a-fib (on dig), seizure disorder. He was admitted in September 2017 for respiratory failure at an outside facility. During that admission, he ended up being vent dependent and had a tracheostomy.   [facility] because of relative hypotension and questionable bloody stools. At the ER, blood pressure was soft. Hemoglobin was 6 from a baseline of 7.5. There was concern for GI bleed, possible sepsis so PCCM was asked to admit. He was a difficult IV access for which ED physician has tried several times and after several hours, was able to get 2 peripheral IVs on him. He ended up getting 4 L of saline and blood pressure remained 90/60, heart rate in the 60s. He was started on vanc and zosyn on admission that was narrowed to ceftaz once respiratory cultures speciated. Cardiology was consulted for bradycardia down to the 30's and for acute reduced EF of 20-25% on ECHO this admission compared to last ECHO in 04/08/2016 from care everywhere at which time EF was  57%. Cardiology recommended aggressive diuresis and levo for BP support. He was not felt to be a candidate for advanced CHF devices or therapies. His stool was FOBT positive but hgb remained stable during hospital course ranging from 7.9-9.9 since the day after admission. He was started on stress dose steroids and his home midodrine was continued. He required pressor support with levophed. For  his chronic afib his home digoxin was held. On discharge patient was net positive 1.6 L up.   Discharge Plan by active problems   Acute on chronic hypoxemic hypercapnic respiratory failure secondary to likely pulmonary edema and pseudomonas PNA. He completed 9 days of abx treatment.  -Continue ventilatory support.  -Scheduled bronchodilators - continue diuretics per cardiology recommendations - digoxin was not resumed on discharge, reassess need for this  Chronic hypotension on midodrine:  - continue home midodrine - Continue Levophen for MAP > 60  Acute on chronic systolic CHF (LVEF 20-25%)  Chronic a-fib -Cardiology was consulted, recommended continue diuresis upon discharge due to massive volume overload. Continue lasix  IV q8h - monitor electrolytes and replete as need - Potassium 3.1 on discharge, repleted with KCl [id number].  Anemia 2/2 presumed GI bleed -FOBT+ on admission, possibly from from PUD, possible contribution of proctitis. Hgb stable throughout admission, he did not require any blood products.   Stool impaction-- pt was given lactulose on admission for stool impaction which was d/c 2 days prior to discharge. Monitor bowel movements.   Significant Hospital tests/ studies :  STUDIES:  Echo 4/9 > LVEF 20-25%, diffuse HK, RSVP 84mm/Hg CXR 4/12 > Stable low volume chest with atelectasis or pneumonia. Degree of superimposed venous congestion or edema. CXR 4/15 >>Slight increase in pulmonary edema pattern with low lung volumes.   CULTURES: Sputum 4/7 >pseduomonas sensitive to ceftaz Blood 4/7 > negative, final  ANTIBIOTICS: Zosyn 4/7 >4/11 Vanc 4/7 > 4/11 Ceftaz 4/11 > 4/15  Consults: Cardiology  Discharge Exam: BP (!) 108/56   Pulse 72   Temp 98 F (  36.7 C) (Oral)   Resp (!) 30   Ht  (1.702 m)   Wt 269 lb 2.9 oz (122.1 kg)   SpO2 98%   BMI 42.16 kg/m   Constitutional: He is well-developed, well-nourished, and in no distress. No distress.    Chronically, ill-appearing man.   HENT:  Head: Normocephalic and atraumatic.  Neck:  Tracheosotomy in place  Cardiovascular: Normal rate and regular rhythm.  Exam reveals no gallop and no friction rub.   No murmur heard. Pulmonary/Chest: Effort normal. No respiratory distress. He has no wheezes.  Abdominal: Soft. He exhibits no distension. There is no tenderness.  PEG tube in place.  Musculoskeletal: He exhibits edema (Generalized).  Neurological: He is alert.  Moving all ext to command  Skin: Skin is dry. He is not diaphoretic.  Dry skin with chronic venous stasis   Labs at discharge Lab Results  Component Value Date   CREATININE 0.38 (L) 11/13/2016   BUN 35 (H) 11/13/2016   NA 149 (H) 11/13/2016   K 3.1 (L) 11/13/2016   CL 110 11/13/2016   CO2 31 11/13/2016   Lab Results  Component Value Date   WBC 6.0 11/13/2016   HGB 8.3 (L) 11/13/2016   HCT 26.2 (L) 11/13/2016   MCV 84.5 11/13/2016   PLT 122 (L) 11/13/2016   Lab Results  Component Value Date   ALT 23 11/05/2016   AST 26 11/05/2016   ALKPHOS 142 (H) 11/05/2016   BILITOT 1.0 11/05/2016   Lab Results  Component Value Date   INR 1.16 11/05/2016    Current radiology studies Dg Chest Port 1 View  Result Date: 11/13/2016 CLINICAL DATA:  Acute respiratory failure EXAM: PORTABLE CHEST 1 VIEW COMPARISON:  11/10/2016 in FINDINGS: Tracheostomy tube and PICC line unchanged. Stable cardiac silhouette. There is increase in a fine airspace pattern within the lungs. Low lung volumes. Likely small effusions. Removal of NG tube. IMPRESSION: Slight increase in pulmonary edema pattern with low lung volumes. Removal of NG tube. Electronically Signed   By: Genevive Bi M.D.   On: 11/13/2016 07:44    Disposition: [facility] Discharge Instructions    Increase activity slowly    Complete by:  As directed      Allergies as of 11/13/2016      Reactions   Chocolate    ON Mar- no reaction    Iodine    Nitroglycerin        Medication List    STOP taking these medications   bumetanide 2 MG tablet Commonly known as:  BUMEX   cefTRIAXone IVPB Commonly known as:  ROCEPHIN   digoxin 0.125 MG tablet Commonly known as:  LANOXIN   docusate sodium 100 MG capsule Commonly known as:  COLACE   Lactulose 20 GM/30ML Soln   Pantoprazole Sodium Powd   tobramycin 10 MG/ML Soln injection Commonly known as:  NEBCIN     TAKE these medications   aspirin 81 MG chewable tablet Place 81 mg into feeding tube daily.   budesonide 0.5 MG/2ML nebulizer solution Commonly known as:  PULMICORT Take 2 mLs (0.5 mg total) by nebulization 2 (two) times daily.   calcium carbonate 500 MG chewable tablet Commonly known as:  TUMS - dosed in mg elemental calcium Chew 500 tablets by mouth daily.   chlorhexidine 0.12 % solution Commonly known as:  PERIDEX Use as directed 15 mLs in the mouth or throat 2 (two) times daily.   famotidine 20-0.9 MG/50ML-% Commonly known as:  PEPCID  Inject 50 mLs (20 mg total) into the vein every 12 (twelve) hours.   FLUoxetine 20 MG tablet Commonly known as:  PROZAC Place 20 mg into feeding tube daily.   fluticasone 50 MCG/ACT nasal spray Commonly known as:  FLONASE Place 1 spray into both nostrils daily.   furosemide 10 MG/ML injection Commonly known as:  LASIX Inject 4 mLs (40 mg total) into the vein every 8 (eight) hours.   insulin detemir 100 UNIT/ML injection Commonly known as:  LEVEMIR Inject 20 Units into the skin at bedtime.   ipratropium-albuterol 0.5-2.5 (3) MG/3ML Soln Commonly known as:  DUONEB Take 3 mLs by nebulization every 6 (six) hours.   loratadine 10 MG tablet Commonly known as:  CLARITIN Place 10 mg into feeding tube daily.   midodrine 10 MG tablet Commonly known as:  PROAMATINE Place 10 mg into feeding tube every 6 (six) hours.   multivitamin with minerals tablet Place 1 tablet into feeding tube daily.   potassium chloride 20 MEQ/15ML (10%) Soln Place  10 mEq into feeding tube 2 (two) times daily.   predniSONE 20 MG tablet Commonly known as:  DELTASONE Place 20 mg into feeding tube daily with breakfast.      Contact information for after-discharge care    Destination    [facility] .   Specialty:  Skilled Nursing Chief of Staff information: [phone] [facility] [place] [address] [phone]              Discharged Condition: fair  Physician Statement:   The Patient was personally examined, the discharge assessment and plan has been personally reviewed and I agree with ACNP Babcock's assessment and plan. > 30 minutes of time have been dedicated to discharge assessment, planning and discharge instructions.   Signed: Gara Kroner 11/13/2016, 9:50 AM

## 2016-11-11 NOTE — Care Management Note (Signed)
Case Management Note Previous Note by Lawerance Sabal 11/08/16  Patient Details  Name: Barry [name] MRN: [id number] Date of Birth: 17-Apr-1966  Subjective/Objective:                 Patient admitted from [facility]. He  has a past medical history of A-fib [facility]); CHF (congestive heart failure) (HCC); Chronic acquired lymphedema; Mitochondrial disease (HCC); Myotonic muscular dystrophy (HCC); and Seizures (HCC). Trach.    Action/Plan:  CM will continue to follow.  Expected Discharge Date:                  Expected Discharge Plan:  Long Term Acute Care (LTAC)  In-House Referral:  Hospice / Palliative Care  Discharge planning Services  CM Consult  Post Acute Care Choice:    Choice offered to:     DME Arranged:    DME Agency:     HH Arranged:    HH Agency:     Status of Service:  In process, will continue to follow  If discussed at Long Length of Stay Meetings, dates discussed:    Additional Comments: 11/11/2016  CM discussed with resident pt status - pt not ready for discharge to facility due to extreme volume overload now starting IV lasix, pt also now has EF of 20% which is new- so cards consulted.  Episodes of bradycardia and back on pressors.  Palliative consulted - continue with aggressive therapies however prognosis continues to be poor.  11/10/16 Discussed in LOS 11/10/16 - pt remains appropriate for continued stay.  CM confirmed that pt is from [facility].  Palliative meeting tentative for today - will follow up post. Cherylann Parr, RN 11/11/2016, 4:30 PM

## 2016-11-12 DIAGNOSIS — I5043 Acute on chronic combined systolic (congestive) and diastolic (congestive) heart failure: Secondary | ICD-10-CM | POA: Diagnosis not present

## 2016-11-12 DIAGNOSIS — A419 Sepsis, unspecified organism: Secondary | ICD-10-CM | POA: Diagnosis not present

## 2016-11-12 DIAGNOSIS — J9600 Acute respiratory failure, unspecified whether with hypoxia or hypercapnia: Secondary | ICD-10-CM | POA: Diagnosis not present

## 2016-11-12 LAB — CBC
HCT: 27.1 % — ABNORMAL LOW (ref 39.0–52.0)
Hemoglobin: 8.4 g/dL — ABNORMAL LOW (ref 13.0–17.0)
MCH: 26.2 pg (ref 26.0–34.0)
MCHC: 31 g/dL (ref 30.0–36.0)
MCV: 84.4 fL (ref 78.0–100.0)
PLATELETS: 127 10*3/uL — AB (ref 150–400)
RBC: 3.21 MIL/uL — AB (ref 4.22–5.81)
RDW: 21.3 % — AB (ref 11.5–15.5)
WBC: 6.3 10*3/uL (ref 4.0–10.5)

## 2016-11-12 LAB — GLUCOSE, CAPILLARY
GLUCOSE-CAPILLARY: 168 mg/dL — AB (ref 65–99)
GLUCOSE-CAPILLARY: 174 mg/dL — AB (ref 65–99)
GLUCOSE-CAPILLARY: 192 mg/dL — AB (ref 65–99)
Glucose-Capillary: 172 mg/dL — ABNORMAL HIGH (ref 65–99)
Glucose-Capillary: 171 mg/dL — ABNORMAL HIGH (ref 65–99)
Glucose-Capillary: 175 mg/dL — ABNORMAL HIGH (ref 65–99)

## 2016-11-12 LAB — BASIC METABOLIC PANEL
ANION GAP: 10 (ref 5–15)
BUN: 26 mg/dL — ABNORMAL HIGH (ref 6–20)
CALCIUM: 8.3 mg/dL — AB (ref 8.9–10.3)
CO2: 27 mmol/L (ref 22–32)
Chloride: 108 mmol/L (ref 101–111)
Creatinine, Ser: 0.46 mg/dL — ABNORMAL LOW (ref 0.61–1.24)
GFR calc non Af Amer: >60 mL/min (ref >60)
Glucose, Bld: 196 mg/dL — ABNORMAL HIGH (ref 65–99)
Potassium: 2.9 mmol/L — ABNORMAL LOW (ref 3.5–5.1)
Sodium: 145 mmol/L (ref 135–145)

## 2016-11-12 LAB — PROCALCITONIN

## 2016-11-12 MED ORDER — POTASSIUM CHLORIDE 20 MEQ/15ML (10%) PO SOLN
40.0000 meq | Freq: Two times a day (BID) | ORAL | Status: AC
  Administered 2016-11-12 (×2): 40 meq via ORAL
  Filled 2016-11-12 (×2): qty 30

## 2016-11-12 MED ORDER — GERHARDT'S BUTT CREAM
TOPICAL_CREAM | CUTANEOUS | Status: DC | PRN
  Administered 2016-11-12: 14:00:00 via TOPICAL
  Administered 2016-11-12: 1 via TOPICAL
  Filled 2016-11-12: qty 1

## 2016-11-12 MED ORDER — SODIUM CHLORIDE 0.9 % IV SOLN
30.0000 meq | Freq: Once | INTRAVENOUS | Status: AC
  Administered 2016-11-12: 30 meq via INTRAVENOUS
  Filled 2016-11-12: qty 15

## 2016-11-12 NOTE — Progress Notes (Signed)
Pharmacy Antibiotic Note  Barry [name] is a 51 y.o. male admitted on 11/05/2016 with sepsis.  Found with pseudomonal PNA.  Pharmacy has been consulted for ceftazidime dosing. Patient with muscular dystrophy and Scr not fully representative of renal function.  Est CrCl ~ 90 ml/min.  BCx remain negative, afebrile, WBC WNL.  Plan: 1. Continue Fortaz 1g IV q 8 hrs. 2. D/c soon?  Height:  (170.2 cm) Weight: 269 lb 2.9 oz (122.1 kg) IBW/kg (Calculated) : 66.1  Temp (24hrs), Avg:97.4 F (36.3 C), Min:96.6 F (35.9 C), Max:98.1 F (36.7 C)   Recent Labs Lab 11/05/16 1011 11/05/16 1141  11/08/16 0408 11/09/16 0346 11/09/16 1045 11/10/16 0415 11/11/16 0415 11/12/16 0432  WBC  --   --   < > 10.4 9.2  --  5.5 4.7 6.3  CREATININE  --   --   < > 0.54* 0.62  --  0.56* 0.51* 0.46*  LATICACIDVEN 0.76 0.69  --   --   --   --   --   --   --   VANCOTROUGH  --   --   --   --   --  28*  --   --   --   < > = values in this interval not displayed.  Estimated Creatinine Clearance: 138.3 mL/min (A) (by C-G formula based on SCr of 0.46 mg/dL (L)).    Allergies  Allergen Reactions  . Chocolate     ON Mar- no reaction   . Iodine   . Nitroglycerin    Antimicrobials this admission:  Vanc/Zosyn  4/6>4/11  [name] 4/11>>  Dose adjustments this admission:  4/11 VT = 28 (vanc d/c'd)  Microbiology results:  4/5 Urine ([facility]): EColi > 100K, s: (p) 4/7 TA GNR- mod pseudomonas S fortaz  4/7 BCx ng final 4/8 MRSA PCR (+) 4/7 UCx multiple species  Thank you for allowing pharmacy to be a part of this patient's care.  [name] 11/12/2016 10:04 AM

## 2016-11-12 NOTE — Progress Notes (Signed)
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   Is requiring low dose Levophed for BP support Is now beginning to diurese on Lasix Q 8 hr.   Inpatient Medications    Scheduled Meds: . budesonide (PULMICORT) nebulizer solution  0.5 mg Nebulization BID  . cefTAZidime (FORTAZ)  IV  1 g Intravenous Q8H  . chlorhexidine gluconate (MEDLINE KIT)  15 mL Mouth Rinse BID  . Chlorhexidine Gluconate Cloth  6 each Topical Q0600  . famotidine (PEPCID) IV  20 mg Intravenous Q12H  . FLUoxetine  20 mg Per Tube Daily  . fluticasone  1 spray Each Nare Daily  . furosemide  40 mg Intravenous Q8H  . hydrocerin   Topical BID  . hydrocortisone sod succinate (SOLU-CORTEF) inj  100 mg Intravenous Q8H  . ipratropium-albuterol  3 mL Nebulization Q6H  . loratadine  10 mg Per Tube Daily  . mouth rinse  15 mL Mouth Rinse 10 times per day  . midodrine  10 mg Per Tube Q6H  . potassium chloride  40 mEq Oral BID  . sodium chloride flush  10-40 mL Intracatheter Q12H   Continuous Infusions: . dextrose Stopped (11/09/16 1700)  . feeding supplement (VITAL HIGH PROTEIN) 1,000 mL (11/11/16 2131)  . norepinephrine (LEVOPHED) Adult infusion Stopped (11/11/16 1215)   PRN Meds: sodium chloride, acetaminophen (TYLENOL) oral liquid 160 mg/5 mL, docusate, fentaNYL (SUBLIMAZE) injection, Gerhardt's butt cream, midazolam, ondansetron (ZOFRAN) IV, sodium chloride flush   Vital Signs    Vitals:   11/12/16 0800 11/12/16 0817 11/12/16 0829 11/12/16 0900  BP: 113/61  116/64 120/65  Pulse: (!) 57  84 (!) 112  Resp: (!) 27  (!) 30 (!) 21  Temp:  97.1 F (36.2 C)    TempSrc:  Axillary    SpO2: 100%  100% 100%  Weight:      Height:        Intake/Output Summary (Last 24 hours) at 11/12/16 0925 Last data filed at 11/12/16 0900  Gross per 24 hour    Intake           2768.1 ml  Output             4624 ml  Net          -1855.9 ml   Filed Weights   11/10/16 0700 11/11/16 0422 11/12/16 0500  Weight: 280 lb 13.9 oz (127.4 kg) 275 lb 5.7 oz (124.9 kg) 269 lb 2.9 oz (122.1 kg)    Telemetry    Atrial fib with controlled V rate.  - Personally Reviewed  ECG    Atrial fib  - Personally Reviewed  Physical Exam   GEN: trach collar, on the vent , unresponsive Neck: very thick neck , difficult to assess JVP Cardiac: Irreg. irreg.  Distant heart sounds  Respiratory: vent sounds  GI: obese,  Abdominal wall edema  MS: 3-4+ pitting past his thighs up to abdomen Neuro:  unresponseive,  Moves his head on occasion   Psych: unable to assess   Labs    Chemistry Recent Labs Lab 11/05/16 1004  11/10/16 0415 11/11/16 0415 11/12/16 0432  NA 135  < > 142 142 145  K 5.4*  < > 4.0 3.0* 2.9*  CL 110  < > 113* 109 108  CO2 18*  < > 20* 23 27  GLUCOSE 91  < >  195* 175* 196*  BUN 28*  < > 15 19 26*  CREATININE 0.63  < > 0.56* 0.51* 0.46*  CALCIUM 7.8*  < > 9.2 8.7* 8.3*  PROT 6.4*  --   --   --   --   ALBUMIN 1.8*  --   --   --   --   AST 26  --   --   --   --   ALT 23  --   --   --   --   ALKPHOS 142*  --   --   --   --   BILITOT 1.0  --   --   --   --   GFRNONAA >60  < > >60 >60 >60  GFRAA >60  < > >60 >60 >60  ANIONGAP 7  < > _0 < > = values in this interval not displayed.   Hematology  Recent Labs Lab 11/10/16 0415 11/11/16 0415 11/12/16 0432  WBC 5.5 4.7 6.3  RBC 3.16* 3.21* 3.21*  HGB 8.3* 8.5* 8.4*  HCT 26.2* 26.8* 27.1*  MCV 82.9 83.5 84.4  MCH 26.3 26.5 26.2  MCHC 31.7 31.7 31.0  RDW 21.1* 21.2* 21.3*  PLT 131* 135* 127*    Cardiac Enzymes  Recent Labs Lab 11/10/16 1302  TROPONINI <0.03   No results for input(s): TROPIPOC in the last 168 hours.   BNP  Recent Labs Lab 11/05/16 1819  BNP 176.2*     DDimer No results for input(s): DDIMER in the last 168 hours.   Radiology    No results  found.  Cardiac Studies     Patient Profile     51 y.o. male with acute on chronic CHF  Assessment & Plan    1. Acute on chronic combined CHF:   Is massively volume overloaded.   Co-ox is 74 Will continue aggressive diuresis . Continue Levophed for BP support I/O are still + . But we are getting a net diuresis every day with the new lasix regimine.    He is not a candidate for advanced CHF devices or therapies. His prognosis is poor .    Signed, Mertie Moores, MD  11/12/2016, 9:25 AM

## 2016-11-12 NOTE — Progress Notes (Signed)
eLink Physician-Brief Progress Note Patient Name: Barry [name] DOB: April 26, 1966 MRN: [id number]   Date of Service  11/12/2016  HPI/Events of Note  k 2.9  eICU Interventions  replaced     Intervention Category Minor Interventions: Electrolytes abnormality - evaluation and management  Henry Russel, P 11/12/2016, 5:58 AM

## 2016-11-12 NOTE — Progress Notes (Signed)
PULMONARY / CRITICAL CARE MEDICINE   Name: Barry [name] MRN: [id number] DOB: 1966-01-01    ADMISSION DATE:  11/05/2016 CONSULTATION DATE:  11/05/2016  REFERRING MD:  Dr. Rosalia Hammers  CHIEF COMPLAINT:  hypotension  HISTORY OF PRESENT ILLNESS:   Barry [name] is a 51yrs Male with history of HTN, mitochondrial disease causing myopathy, recently vent dependent at [facility], sCHF, a-fib (on dig), seizure disorder. He was admitted in September 2017 for respiratory failure at an outside facility. During that admission, he ended up being vent dependent and had a tracheostomy.  Unfortunately, I am unable to obtain history from the patient as he is encephalopathic so chart review was done. Patient's blood pressure chronically  runs low for which he is on midodrine  [facility] because of relative hypotension, questionable bloody stools. At the ER, blood pressure was soft. Hemoglobin was 6 from a baseline of 7.5. There was concern for GI bleed, possible sepsis so PCCM was asked to admit. He was a difficult IV access for which ED physician has tried several times and after several hours, was able to get 2 peripheral IVs on him. He ended up getting 4 L of saline and blood pressure remained 90/60, heart rate in the 60s.   SUBJECTIVE:  No acute events overnight. Weaned off pressors Has copious diarrhea  VITAL SIGNS: BP 116/64   Pulse 84   Temp 97.1 F (36.2 C) (Axillary)   Resp (!) 30   Ht  (1.702 m)   Wt 269 lb 2.9 oz (122.1 kg)   SpO2 100%   BMI 42.16 kg/m   HEMODYNAMICS: CVP:  [15 mmHg-21 mmHg] 21 mmHg  VENTILATOR SETTINGS: Vent Mode: PRVC FiO2 (%):  [30 %] 30 % Set Rate:  [30 bmp] 30 bmp Vt Set:  [400 mL] 400 mL PEEP:  [5 cmH20] 5 cmH20 Plateau Pressure:  [16 cmH20-30 cmH20] 28 cmH20  INTAKE / OUTPUT: I/O last 3 completed shifts: In: 4120.2 [I.V.:35.2; Other:750; NG/GT:2520; IV Piggyback:815] Out: 6173 [Urine:6170; Stool:3]  PHYSICAL  EXAMINATION:  Physical Exam  Constitutional: He is well-developed, well-nourished, and in no distress. No distress.  Chronically, ill-appearing man.   HENT:  Head: Normocephalic and atraumatic.  Neck:  Tracheosotomy in place  Cardiovascular: Normal rate and regular rhythm.  Exam reveals no gallop and no friction rub.   No murmur heard. Pulmonary/Chest: Effort normal. No respiratory distress. He has no wheezes.  Abdominal: Soft. He exhibits no distension. There is no tenderness.  PEG tube in place.  Musculoskeletal: He exhibits edema (Generalized).  Neurological:  Unresponsive   Skin: Skin is dry. He is not diaphoretic.  Dry skin with chronic venous stasis    LABS:  BMET  Recent Labs Lab 11/10/16 0415 11/11/16 0415 11/12/16 0432  NA 142 142 145  K 4.0 3.0* 2.9*  CL 113* 109 108  CO2 20* 23 27  BUN 15 19 26*  CREATININE 0.56* 0.51* 0.46*  GLUCOSE 195* 175* 196*    Electrolytes  Recent Labs Lab 11/09/16 1700 11/10/16 0415 11/10/16 1600 11/11/16 0415 11/12/16 0432  CALCIUM  --  9.2  --  8.7* 8.3*  MG 1.9 2.0 1.9  --   --   PHOS 4.0 4.1 3.7  --   --     CBC  Recent Labs Lab 11/10/16 0415 11/11/16 0415 11/12/16 0432  WBC 5.5 4.7 6.3  HGB 8.3* 8.5* 8.4*  HCT 26.2* 26.8* 27.1*  PLT 131* 135* 127*    Coag's  Recent Labs Lab 11/05/16 1120  INR 1.16    Sepsis Markers  Recent Labs Lab 11/05/16 1011 11/05/16 1141  LATICACIDVEN 0.76 0.69    ABG  Recent Labs Lab 11/05/16 0955 11/05/16 2313  PHART 7.264* 7.224*  PCO2ART 44.2 47.5  PO2ART 146.0* 25.0*    Liver Enzymes  Recent Labs Lab 11/05/16 1004  AST 26  ALT 23  ALKPHOS 142*  BILITOT 1.0  ALBUMIN 1.8*    Glucose  Recent Labs Lab 11/11/16 1135 11/11/16 1550 11/11/16 2003 11/11/16 2331 11/12/16 0408 11/12/16 0814  GLUCAP 168* 171* 167* 146* 168* 174*    Imaging No results found.  STUDIES:  Echo 4/9 > LVEF 20-25%, diffuse HK, RSVP 36mm/Hg CXR 4/12 > Stable low  volume chest with atelectasis or pneumonia. Degree of superimposed venous congestion or edema.  CULTURES: Sputum 4/7 >pseduomonas Blood 4/7 > negative, final  ANTIBIOTICS: Zosyn 4/7 >4/11 Vanc 4/7 > 4/11 Ceftaz 4/11 >  SIGNIFICANT EVENTS: 4/7 Admitted to ICU  LINES/TUBES: Trache 04/2016 > OSH L PICC > out R picc 4/9 >>>  DISCUSSION: Mr. Cherubini is a 51 year old man, chronic ventilator dependent, related to mitochondrial disease, [facility] because of hypotension, bloody stools. There is also concern for sepsis/occult infection. He was admitted to ICU and started on broad spectrum ABX for occult sepsis and required pressors for shock/relative hypotension.  ASSESSMENT / PLAN:  Acute on chronic hypoxemic hypercapnic respiratory failure secondary to likely pulmonary edema, concern for HCAP Ventilator dependence secondary to muscular weakness from mitochondrial disease. Probable healthcare associated pneumonia -Continue vent support -Continue ceftaz. Check pro calcitonin. Likely stop after today -Continue bronchodilators  Chronic hypotension on midodrine: concern for lower blood pressure than baseline likely related to sepsis, possible GI bleed - Continue midodrine - Start stress dose steroids if BP is stable tomorrow - Off pressors  Acute on chronic systolic CHF (LVEF 20-25%)  Chronic a-fib - Appreciate recommendations from cardiology - Continue diuresis - Repeat electrolytes  GI bleeding: FOBT+ on admission, possibly from from PUD, possible contribution of proctitis. No reports of bloody BMs for past 3 days - Stop Lactulose - Check C. difficile if diarrhea persists  Anemia 2/2 UGI Bleed- stable -Follow CBC  Encephalopathy related to sepsis + chronic deconditioning secondary to mitochondrial disorder: Baseline unclear as he is able to follow commands though cannot understand his speech. -RASS goal: 0 to -1 -When necessary sedation   FAMILY  -  Updates: no family available - palliative care following, family wants everything done. Likely [facility] to [facility] within the next day or 2  - Inter-disciplinary family meet or Palliative Care meeting due by:  11/12/16  The patient is critically ill with multiple organ system failure and requires high complexity decision making for assessment and support, frequent evaluation and titration of therapies, advanced monitoring, review of radiographic studies and interpretation of complex data.   Critical Care Time devoted to patient care services, exclusive of separately billable procedures, described in this note is 45 minutes.   Chilton Greathouse MD [place] [facility] Pager [phone] If no answer or after 3pm call: [phone] 11/12/2016, 9:23 AM

## 2016-11-13 ENCOUNTER — Inpatient Hospital Stay (HOSPITAL_COMMUNITY): Payer: Medicare Other

## 2016-11-13 DIAGNOSIS — A419 Sepsis, unspecified organism: Secondary | ICD-10-CM | POA: Diagnosis not present

## 2016-11-13 DIAGNOSIS — J9600 Acute respiratory failure, unspecified whether with hypoxia or hypercapnia: Secondary | ICD-10-CM | POA: Diagnosis not present

## 2016-11-13 DIAGNOSIS — I5043 Acute on chronic combined systolic (congestive) and diastolic (congestive) heart failure: Secondary | ICD-10-CM | POA: Diagnosis not present

## 2016-11-13 LAB — BASIC METABOLIC PANEL
Anion gap: 8 (ref 5–15)
BUN: 35 mg/dL — ABNORMAL HIGH (ref 6–20)
CALCIUM: 8.1 mg/dL — AB (ref 8.9–10.3)
CHLORIDE: 110 mmol/L (ref 101–111)
CO2: 31 mmol/L (ref 22–32)
Creatinine, Ser: 0.38 mg/dL — ABNORMAL LOW (ref 0.61–1.24)
GFR calc non Af Amer: >60 mL/min (ref >60)
Glucose, Bld: 191 mg/dL — ABNORMAL HIGH (ref 65–99)
POTASSIUM: 3.1 mmol/L — AB (ref 3.5–5.1)
Sodium: 149 mmol/L — ABNORMAL HIGH (ref 135–145)

## 2016-11-13 LAB — GLUCOSE, CAPILLARY
GLUCOSE-CAPILLARY: 200 mg/dL — AB (ref 65–99)
Glucose-Capillary: 184 mg/dL — ABNORMAL HIGH (ref 65–99)
Glucose-Capillary: 133 mg/dL — ABNORMAL HIGH (ref 65–99)

## 2016-11-13 LAB — PHOSPHORUS: PHOSPHORUS: 2.3 mg/dL — AB (ref 2.5–4.6)

## 2016-11-13 LAB — CBC
HCT: 26.2 % — ABNORMAL LOW (ref 39.0–52.0)
Hemoglobin: 8.3 g/dL — ABNORMAL LOW (ref 13.0–17.0)
MCH: 26.8 pg (ref 26.0–34.0)
MCHC: 31.7 g/dL (ref 30.0–36.0)
MCV: 84.5 fL (ref 78.0–100.0)
PLATELETS: 122 10*3/uL — AB (ref 150–400)
RBC: 3.1 MIL/uL — AB (ref 4.22–5.81)
RDW: 21.7 % — ABNORMAL HIGH (ref 11.5–15.5)
WBC: 6 10*3/uL (ref 4.0–10.5)

## 2016-11-13 LAB — MAGNESIUM: Magnesium: 1.6 mg/dL — ABNORMAL LOW (ref 1.7–2.4)

## 2016-11-13 LAB — PROCALCITONIN

## 2016-11-13 MED ORDER — IPRATROPIUM-ALBUTEROL 0.5-2.5 (3) MG/3ML IN SOLN: 3.0000 mL | Freq: Four times a day (QID) | RESPIRATORY_TRACT | 11 refills | Status: AC

## 2016-11-13 MED ORDER — POTASSIUM CHLORIDE 20 MEQ/15ML (10%) PO SOLN
30.0000 meq | ORAL | Status: AC
  Administered 2016-11-13 (×2): 30 meq
  Filled 2016-11-13 (×2): qty 30

## 2016-11-13 MED ORDER — FUROSEMIDE 10 MG/ML IJ SOLN: 40.0000 mg | Freq: Three times a day (TID) | INTRAMUSCULAR | 0 refills | Status: AC

## 2016-11-13 MED ORDER — MAGNESIUM SULFATE 2 GM/50ML IV SOLN
2.0000 g | Freq: Once | INTRAVENOUS | Status: AC
  Administered 2016-11-13: 2 g via INTRAVENOUS
  Filled 2016-11-13: qty 50

## 2016-11-13 MED ORDER — POTASSIUM CHLORIDE 20 MEQ/15ML (10%) PO SOLN
30.0000 meq | Freq: Once | ORAL | Status: AC
  Administered 2016-11-13: 30 meq
  Filled 2016-11-13: qty 30

## 2016-11-13 MED ORDER — FAMOTIDINE IN NACL 20-0.9 MG/50ML-% IV SOLN: 20.0000 mg | Freq: Two times a day (BID) | INTRAVENOUS | 11 refills | Status: AC

## 2016-11-13 MED ORDER — BUDESONIDE 0.5 MG/2ML IN SUSP: 0.5000 mg | Freq: Two times a day (BID) | RESPIRATORY_TRACT | 12 refills | Status: AC

## 2016-11-13 NOTE — Clinical Social Work Note (Signed)
Clinical Social Worker facilitated patient discharge including contacting patient family and facility to confirm patient discharge plans.  Clinical information faxed to facility and family agreeable with plan.  CSW arranged ambulance transport via [facility] to [facility]. RN to call report [phone], room 412 prior to discharge.  Clinical Social Worker will sign off for now as social work intervention is no longer needed. Please consult [place] again if new need arises.  Katera Rybka B. Gean Quint Clinical Social Work Dept Weekend Social Worker [phone] 2:34 PM

## 2016-11-13 NOTE — Progress Notes (Signed)
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   On  low dose Levophed for BP support Is now beginning to diurese on Lasix Q 8 hr.  Has been diuresing steadily   Inpatient Medications    Scheduled Meds: . budesonide (PULMICORT) nebulizer solution  0.5 mg Nebulization BID  . chlorhexidine gluconate (MEDLINE KIT)  15 mL Mouth Rinse BID  . Chlorhexidine Gluconate Cloth  6 each Topical Q0600  . famotidine (PEPCID) IV  20 mg Intravenous Q12H  . FLUoxetine  20 mg Per Tube Daily  . fluticasone  1 spray Each Nare Daily  . furosemide  40 mg Intravenous Q8H  . hydrocerin   Topical BID  . ipratropium-albuterol  3 mL Nebulization Q6H  . loratadine  10 mg Per Tube Daily  . mouth rinse  15 mL Mouth Rinse 10 times per day  . midodrine  10 mg Per Tube Q6H  . potassium chloride  30 mEq Per Tube Q4H  . sodium chloride flush  10-40 mL Intracatheter Q12H   Continuous Infusions: . dextrose Stopped (11/09/16 1700)  . feeding supplement (VITAL HIGH PROTEIN) 1,000 mL (11/13/16 0729)  . norepinephrine (LEVOPHED) Adult infusion Stopped (11/11/16 1215)   PRN Meds: sodium chloride, acetaminophen (TYLENOL) oral liquid 160 mg/5 mL, docusate, fentaNYL (SUBLIMAZE) injection, Gerhardt's butt cream, midazolam, ondansetron (ZOFRAN) IV, sodium chloride flush   Vital Signs    Vitals:   11/13/16 0430 11/13/16 0500 11/13/16 0600 11/13/16 0700  BP:   (!) 102/51 (!) 108/56  Pulse:  93 83 72  Resp:  20 (!) 31 (!) 30  Temp: 98 F (36.7 C)   98 F (36.7 C)  TempSrc: Axillary   Oral  SpO2:  95% 100% 98%  Weight:  269 lb 2.9 oz (122.1 kg)    Height:        Intake/Output Summary (Last 24 hours) at 11/13/16 0903 Last data filed at 11/13/16 0734  Gross per 24 hour  Intake          1991.33 ml  Output             3543 ml  Net          -1551.67 ml   Filed Weights   11/11/16 0422 11/12/16 0500 11/13/16 0500  Weight: 275 lb 5.7 oz (124.9 kg) 269 lb 2.9 oz (122.1 kg) 269 lb 2.9 oz (122.1 kg)    Telemetry    Atrial fib with controlled V rate.  - Personally Reviewed  ECG    Atrial fib  - Personally Reviewed  Physical Exam   GEN: trach collar, on the vent , unresponsive Neck: very thick neck , difficult to assess JVP Cardiac: Irreg. irreg.  Distant heart sounds  Respiratory: vent sounds  GI: obese,  Abdominal wall edema  MS: 3-4+ pitting past his thighs up to abdomen Neuro:  unresponseive,  Moves his head on occasion   Psych: unable to assess   Labs    Chemistry  Recent Labs Lab 11/11/16 0415 11/12/16 0432 11/13/16 0410  NA 142 145 149*  K 3.0* 2.9* 3.1*  CL 109 108 110  CO2 23 27 31   GLUCOSE 175* 196* 191*  BUN 19 26* 35*  CREATININE 0.51* 0.46* 0.38*  CALCIUM 8.7* 8.3* 8.1*  GFRNONAA >60 >60 >60  GFRAA >60 >60 >60  ANIONGAP 10 10  8     Hematology  Recent Labs Lab 11/11/16 0415 11/12/16 0432 11/13/16 0410  WBC 4.7 6.3 6.0  RBC 3.21* 3.21* 3.10*  HGB 8.5* 8.4* 8.3*  HCT 26.8* 27.1* 26.2*  MCV 83.5 84.4 84.5  MCH 26.5 26.2 26.8  MCHC 31.7 31.0 31.7  RDW 21.2* 21.3* 21.7*  PLT 135* 127* 122*    Cardiac Enzymes  Recent Labs Lab 11/10/16 1302  TROPONINI <0.03   No results for input(s): TROPIPOC in the last 168 hours.   BNP No results for input(s): BNP, PROBNP in the last 168 hours.   DDimer No results for input(s): DDIMER in the last 168 hours.   Radiology    Dg Chest Port 1 View  Result Date: 11/13/2016 CLINICAL DATA:  Acute respiratory failure EXAM: PORTABLE CHEST 1 VIEW COMPARISON:  11/10/2016 in FINDINGS: Tracheostomy tube and PICC line unchanged. Stable cardiac silhouette. There is increase in a fine airspace pattern within the lungs. Low lung volumes. Likely small effusions. Removal of NG tube. IMPRESSION: Slight increase in pulmonary edema pattern with low lung  volumes. Removal of NG tube. Electronically Signed   By: Suzy Bouchard M.D.   On: 11/13/2016 07:44    Cardiac Studies     Patient Profile     51 y.o. male with acute on chronic CHF  Assessment & Plan    1. Acute on chronic combined CHF:   Is massively volume overloaded.   Co-ox is 74 Will continue aggressive diuresis . Continue Levophed for BP support I/O are still + . But we are getting a net diuresis every day with the new lasix regimine.  He is scheduled to go back to [facility] today or tomorrow.  Would continue current lasix and Levophed as we are doing for now   He is not a candidate for advanced CHF devices or therapies. His prognosis is poor .   Will sign off.    Signed, Mertie Moores, MD  11/13/2016, 9:03 AM

## 2016-11-13 NOTE — Progress Notes (Signed)
   11/13/16 0944  Medical Necessity for Transport Certificate --- IF THIS TRANSPORT IS ROUND TRIP OR SCHEDULED AND REPEATED, A PHYSICIAN MUST COMPLETE THIS FORM  Transport from: (Location) [facility]  Transport to Scientist, product/process development) Other (Comment) ([facility] [address] [place] )  Is this the closest appropriate facility? Yes  Date of Transport Service 11/13/16  Name of Transporting Agency [facility]  Round Trip Transport? No  Reason for Transport Discharge  Is this a hospice patient? No  Describe the Medical Condition Generalized Weakness  Q1 Are ALL the following "true"? 1. Patient unable to get up from bed without assistance  AND  2. Unable to ambulate  AND  3. Unable to sit in a chair, including wheelchair. Yes  Q2 Could the patient be transported safely by other means of transportation (I.E., wheelchair van)? No  Q3 Please check any of the following conditions that apply at the time of transport: Risk of injury to self and/or others Primary school teacher)  Building surveyor (can be Physician, RN, NP, PA, DP, CNS) Mitzi Davenport B. Manson Passey MSW LCSWA DP  Date Signed 11/13/16  Print Form Print

## 2016-11-13 NOTE — Clinical Social Work Placement (Signed)
   CLINICAL SOCIAL WORK PLACEMENT  NOTE  Date:  11/13/2016  Patient Details  Name: Barry [name] MRN: [id number] Date of Birth: June 19, 1966  Clinical Social Work is seeking post-discharge placement for this patient at the Skilled  Nursing Facility level of care (*CSW will initial, date and re-position this form in  chart as items are completed):  Yes   Patient/family provided with [facility] [place] Clinical Social Work Department's list of facilities offering this level of care within the geographic area requested by the patient (or if unable, by the patient's family).  Yes   Patient/family informed of their freedom to choose among providers that offer the needed level of care, that participate in Medicare, Medicaid or managed care program needed by the patient, have an available bed and are willing to accept the patient.      Patient/family informed of [facility]'s ownership interest in [facility] and [facility], as well as of the fact that they are under no obligation to receive care at these facilities.  PASRR submitted to EDS on       PASRR number received on       Existing PASRR number confirmed on 11/13/16     FL2 transmitted to all facilities in geographic area requested by pt/family on       FL2 transmitted to all facilities within larger geographic area on       Patient informed that his/her managed care company has contracts with or will negotiate with certain facilities, including the following:        Yes   Patient/family informed of bed offers received.  Patient chooses bed at  (Pt from [facility])     Physician recommends and patient chooses bed at      Patient to be transferred to  (Pt from [facility]) on 11/13/16.  Patient to be transferred to facility by  Sh[facility] Mons)     Patient family notified on 11/13/16 of transfer.  Name of family member notified:        PHYSICIAN       Additional Comment:    _______________________________________________ Norlene Duel, LCSWA 11/13/2016, 2:46 PM

## 2016-11-13 NOTE — Progress Notes (Signed)
[facility] ADULT ICU REPLACEMENT PROTOCOL FOR AM LAB REPLACEMENT ONLY  The patient does apply for the [facility] Adult ICU Electrolyte Replacment Protocol based on the criteria listed below:   1. Is GFR >/= 40 ml/min? Yes.    Patient's GFR today is >60 2. Is urine output >/= 0.5 ml/kg/hr for the last 6 hours? Yes.   Patient's UOP is 2.3 ml/kg/hr 3. Is BUN < 60 mg/dL? Yes.    Patient's BUN today is 35 4. Abnormal electrolyte(s): K+3.1  Mg 1.6 5. Ordered repletion with: Protocol 6. If a panic level lab has been reported, has the CCM MD in charge been notified? No..   Physician:  Anselm Pancoast, Renae Fickle Hilliard 11/13/2016 6:02 AM

## 2016-11-13 NOTE — Progress Notes (Signed)
PULMONARY / CRITICAL CARE MEDICINE   Name: Barry [name] MRN: [id number] DOB: 19-May-1966    ADMISSION DATE:  11/05/2016 CONSULTATION DATE:  11/05/2016  REFERRING MD:  Dr. Rosalia Hammers  CHIEF COMPLAINT:  hypotension  HISTORY OF PRESENT ILLNESS:   Barry [name] is a 16yrs Male with history of HTN, mitochondrial disease causing myopathy, recently vent dependent at [facility], sCHF, a-fib (on dig), seizure disorder. He was admitted in September 2017 for respiratory failure at an outside facility. During that admission, he ended up being vent dependent and had a tracheostomy.  Unfortunately, I am unable to obtain history from the patient as he is encephalopathic so chart review was done. Patient's blood pressure chronically  runs low for which he is on midodrine  [facility] because of relative hypotension, questionable bloody stools. At the ER, blood pressure was soft. Hemoglobin was 6 from a baseline of 7.5. There was concern for GI bleed, possible sepsis so PCCM was asked to admit. He was a difficult IV access for which ED physician has tried several times and after several hours, was able to get 2 peripheral IVs on him. He ended up getting 4 L of saline and blood pressure remained 90/60, heart rate in the 60s.   SUBJECTIVE:  No acute overnight events. More alert and moving all extremities to command.   VITAL SIGNS: BP (!) 108/56   Pulse 72   Temp 98 F (36.7 C) (Axillary)   Resp (!) 30   Ht  (1.702 m)   Wt 269 lb 2.9 oz (122.1 kg)   SpO2 98%   BMI 42.16 kg/m   HEMODYNAMICS: CVP:  [12 mmHg-21 mmHg] 15 mmHg  VENTILATOR SETTINGS: Vent Mode: PRVC FiO2 (%):  [30 %] 30 % Set Rate:  [30 bmp] 30 bmp Vt Set:  [400 mL] 400 mL PEEP:  [5 cmH20] 5 cmH20 Plateau Pressure:  [25 cmH20-28 cmH20] 25 cmH20  INTAKE / OUTPUT: I/O last 3 completed shifts: In: 3726.5 [I.V.:40.2; Other:500; NG/GT:2571.3; IV Piggyback:615] Out: 4098 [JXBJY:7829; Stool:7]  PHYSICAL  EXAMINATION:  Physical Exam  Constitutional: He is well-developed, well-nourished, and in no distress. No distress.  Chronically, ill-appearing man.   HENT:  Head: Normocephalic and atraumatic.  Neck:  Tracheosotomy in place  Cardiovascular: Normal rate and regular rhythm.  Exam reveals no gallop and no friction rub.   No murmur heard. Pulmonary/Chest: Effort normal. No respiratory distress. He has no wheezes.  Abdominal: Soft. He exhibits no distension. There is no tenderness.  PEG tube in place.  Musculoskeletal: He exhibits edema (Generalized).  Neurological: He is alert.  Moving all ext to command  Skin: Skin is dry. He is not diaphoretic.  Dry skin with chronic venous stasis    LABS:  BMET  Recent Labs Lab 11/11/16 0415 11/12/16 0432 11/13/16 0410  NA 142 145 149*  K 3.0* 2.9* 3.1*  CL 109 108 110  CO2 BUN 19 26* 35*  CREATININE 0.51* 0.46* 0.38*  GLUCOSE 175* 196* 191*    Electrolytes  Recent Labs Lab 11/10/16 0415 11/10/16 1600 11/11/16 0415 11/12/16 0432 11/13/16 0410  CALCIUM 9.2  --  8.7* 8.3* 8.1*  MG 2.0 1.9  --   --  1.6*  PHOS 4.1 3.7  --   --  2.3*    CBC  Recent Labs Lab 11/11/16 0415 11/12/16 0432 11/13/16 0410  WBC 4.7 6.3 6.0  HGB 8.5* 8.4* 8.3*  HCT 26.8* 27.1* 26.2*  PLT 135* 127* 122*    Coag's No results for input(s): APTT, INR in the last 168 hours.  Sepsis Markers  Recent Labs Lab 11/12/16 1037 11/13/16 0410  PROCALCITON <0.10 <0.10    ABG No results for input(s): PHART, PCO2ART, PO2ART in the last 168 hours.  Liver Enzymes No results for input(s): AST, ALT, ALKPHOS, BILITOT, ALBUMIN in the last 168 hours.  Glucose  Recent Labs Lab 11/12/16 0814 11/12/16 1145 11/12/16 1615 11/12/16 1959 11/12/16 2317 11/13/16 0433  GLUCAP 174* 172* 171* 175* 192* 184*    Imaging No results found.  STUDIES:  Echo 4/9 > LVEF 20-25%, diffuse HK, RSVP 3mm/Hg CXR 4/12 > Stable low volume chest with  atelectasis or pneumonia. Degree of superimposed venous congestion or edema. CXR 4/15 >>Slight increase in pulmonary edema pattern with low lung volumes  CULTURES: Sputum 4/7 >pseduomonas Blood 4/7 > negative, final  ANTIBIOTICS: Zosyn 4/7 >4/11 Vanc 4/7 > 4/11 Ceftaz 4/11 >4/15  SIGNIFICANT EVENTS: 4/7 Admitted to ICU  LINES/TUBES: Trache 04/2016 > OSH L PICC > out R picc 4/9 >>>  DISCUSSION: Mr. Barry [name] is a 51 year old man, chronic ventilator dependent, related to mitochondrial disease, admitted from [facility] facility because of hypotension, bloody stools. There is also concern for sepsis/occult infection. He was admitted to ICU and started on broad spectrum ABX for occult sepsis and required pressors for shock/relative hypotension.  ASSESSMENT / PLAN:  Acute on chronic hypoxemic hypercapnic respiratory failure secondary to likely pulmonary edema, concern for HCAP Ventilator dependence secondary to muscular weakness from mitochondrial disease. Probable healthcare associated pneumonia -Continue vent support - pro calcitonin negative, stopped ceftaz for total of 9 days abx  -Continue bronchodilators - stable for transfer to [facility]   Chronic hypotension on midodrine: concern for lower blood pressure than baseline likely related to sepsis, possible GI bleed - Continue midodrine - Off pressors  - stopped stress dose steroids 4/15  Acute on chronic systolic CHF (LVEF 20-25%)  Chronic a-fib - Appreciate recommendations from cardiology - Continue diuresis - Repeat electrolytes  GI bleeding: FOBT+ on admission, possibly from from PUD, possible contribution of proctitis.  - Stop Lactulose   Anemia 2/2 UGI Bleed- stable -Follow CBC  Encephalopathy related to sepsis + chronic deconditioning secondary to mitochondrial disorder: Baseline unclear as he is able to follow commands though cannot understand his speech. -RASS goal: 0 to -1 -When necessary sedation   FAMILY  -  Updates: no family available - palliative care following, family wants everything done.   - Inter-disciplinary family meet or Palliative Care meeting due by:  11/12/16  Gara Kroner, MD Internal Medicine Resident, PGY [facility] Internal Medicine Program Pager: [phone]   11/13/2016, 7:41 AM

## 2017-01-29 DEATH — deceased

## 2018-08-21 IMAGING — CT CT HEAD W/O CM
5 of 8 series · 16 of 47 positions shown, 17 images · non-contrast
Comparison: None.

CLINICAL DATA: Altered mental status today.  No recent injury.

EXAM:
CT HEAD WITHOUT CONTRAST
TECHNIQUE: Contiguous axial images were obtained from the base of the skull
through the vertex without intravenous contrast.

[Series 2: head bone · axial · 0.46mm/px · z∈[-51,+19]mm · 5 of 78 slices shown]
[im 8/78  bone]
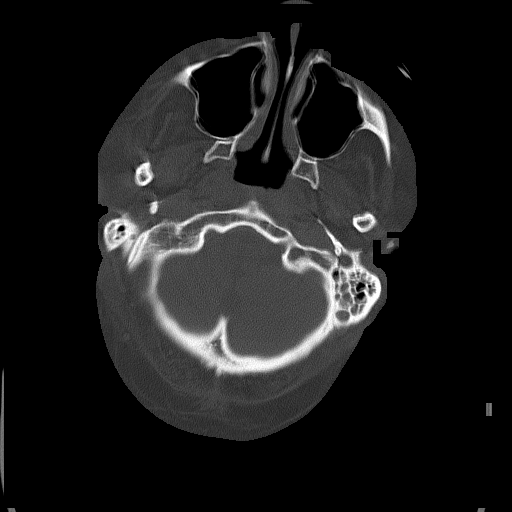
[im 15/78  bone]
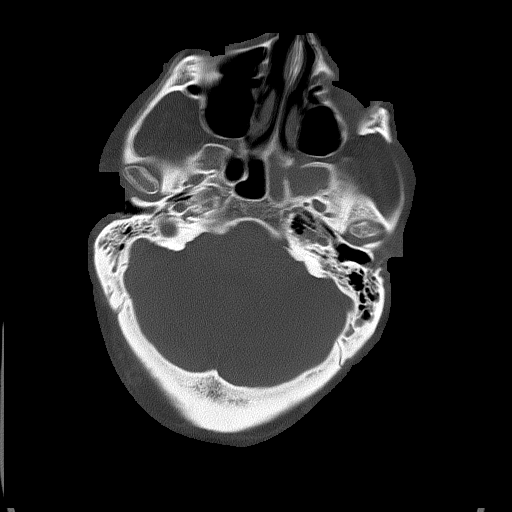
[im 29/78  bone]
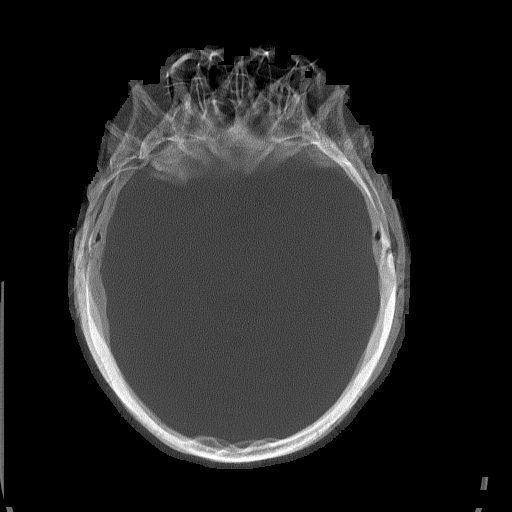
[im 36/78  bone]
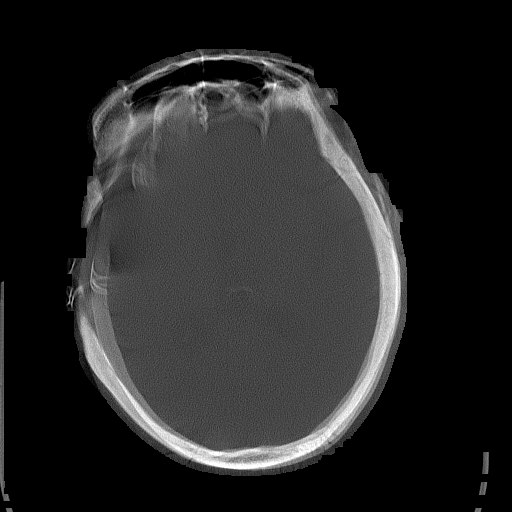
[im 43/78  bone]
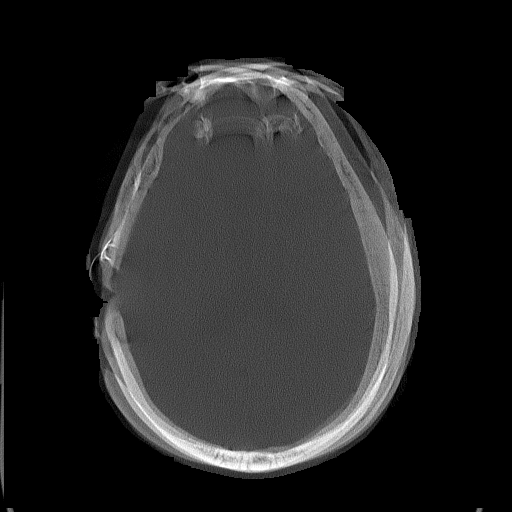

[Series 4: head without · axial · non-contrast · 0.46mm/px · z∈[-30,+45]mm · 3 of 31 slices shown, 4 images (1 of 2)]
[im 8/31  brain]
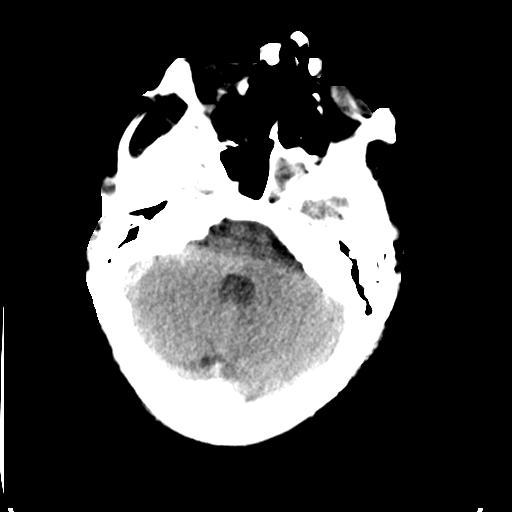
[im 8/31  bone]
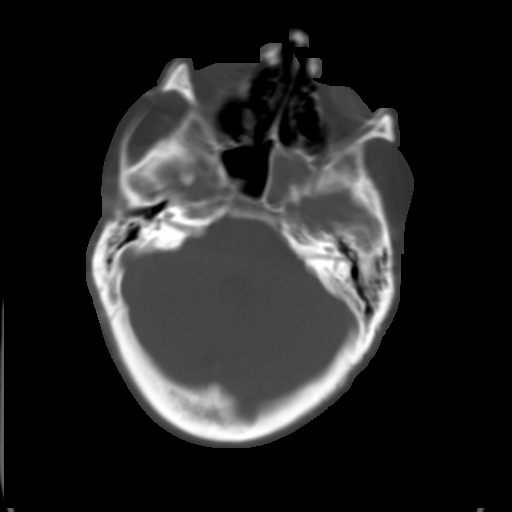
[im 16/31  brain]
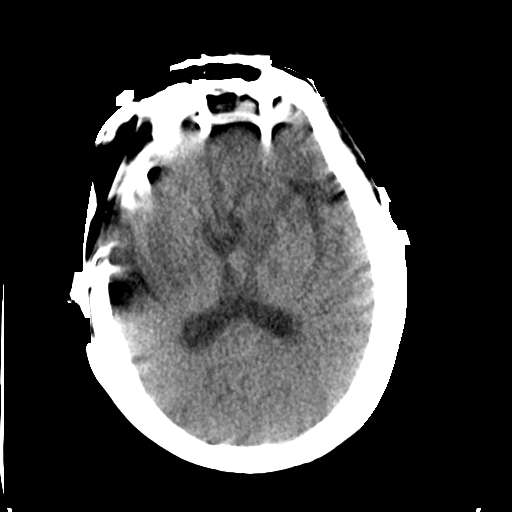
[im 23/31  brain]
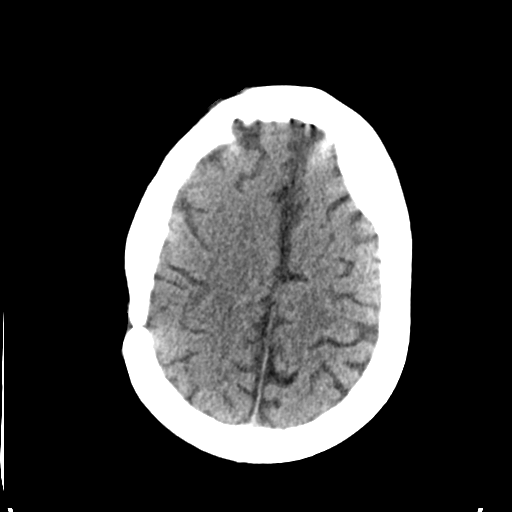

[Series 5: head without cor · coronal · non-contrast · 0.32mm/px · 3 of 66 slices shown]
[im 17/66  brain]
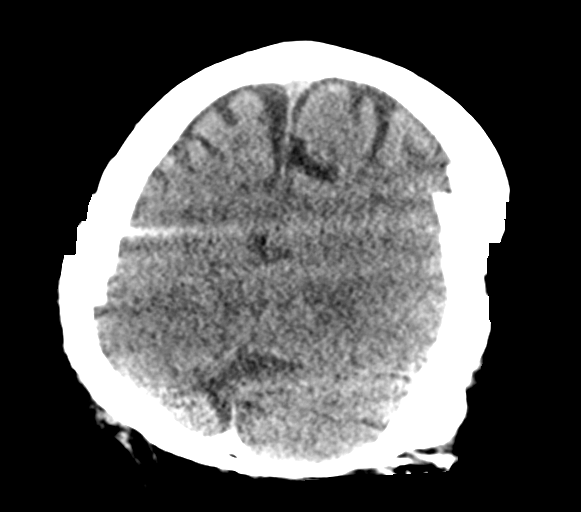
[im 33/66  brain]
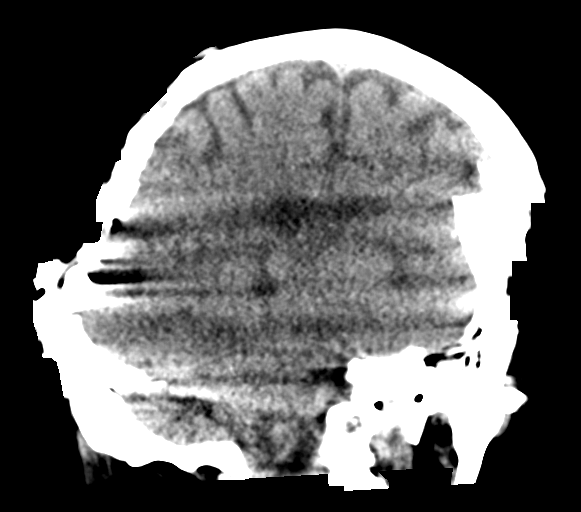
[im 49/66  brain]
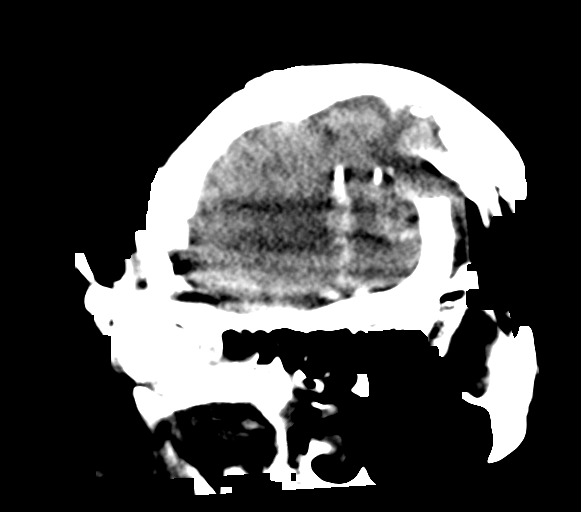

[Series 7: head without · axial · non-contrast · 0.46mm/px · z∈[-25,+15]mm · 2 of 25 slices shown (2 of 2)]
[im 9/25  brain]
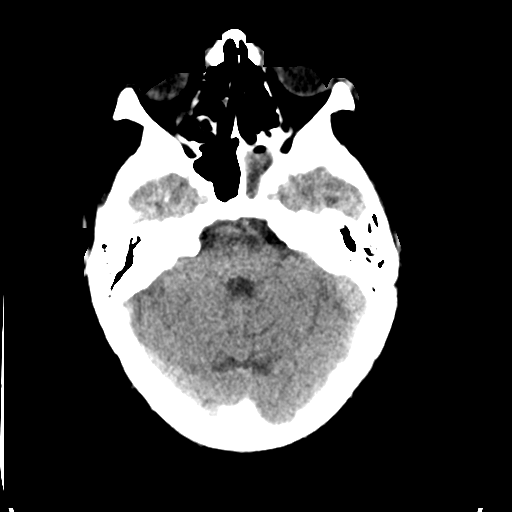
[im 17/25  brain]
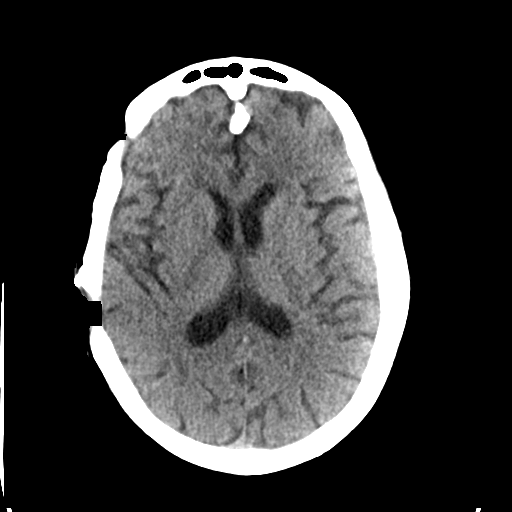

[Series 9: head without sag · sagittal · non-contrast · 0.26mm/px · 3 of 54 slices shown]
[im 14/54  brain]
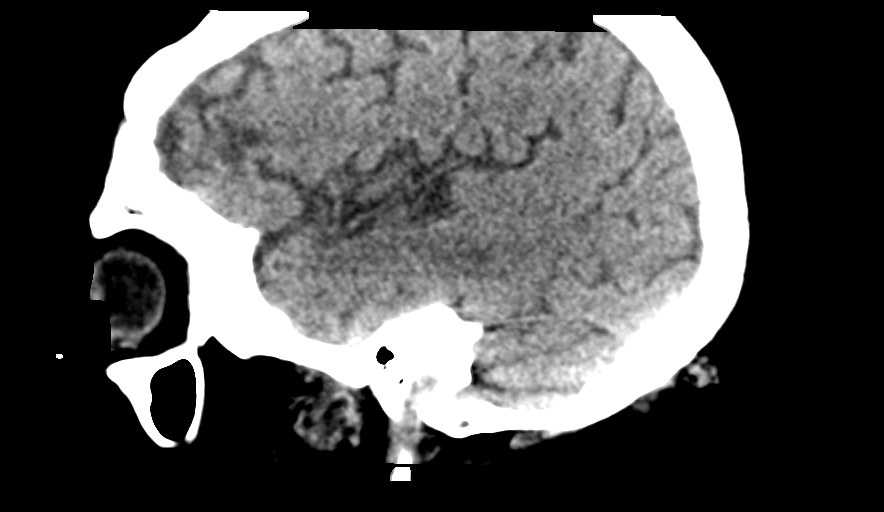
[im 27/54  brain]
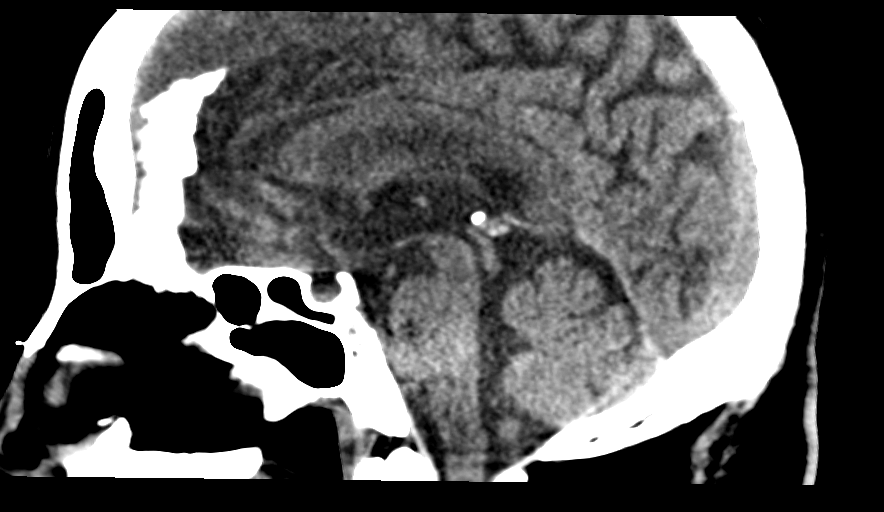
[im 40/54  brain]
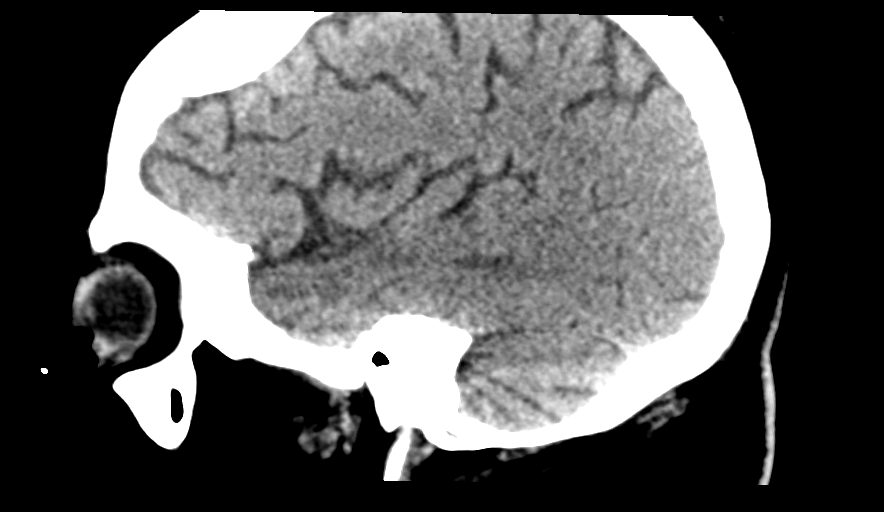

[16 of 47 positions shown; findings below may reference images not displayed]

FINDINGS: Brain: There is mild central and cortical atrophy. Encephalomalacia
is identified within the right frontal and temporal lobes. There is
no evidence for hemorrhage, mass lesion, or acute infarction. Study
quality is degraded by significant patient motion artifact. Numerous
cuts are repeated.

Vascular: No hyperdense vessel or unexpected calcification.

Skull: Status post right craniotomy.  No acute fracture identified.

Sinuses/Orbits: There is significant opacity throughout the ethmoid
and left sphenoid air cells. Bilateral mastoid effusions are
present. Suspect remote fracture of the medial wall of the right
orbit. The globes are intact.

Other: None
IMPRESSION: 1. Atrophy and right frontal and temporal encephalomalacia without
evidence for acute intracranial abnormality.
2. Post right craniotomy changes.
3. Paranasal sinus disease.
4. Bilateral mastoid effusions.

## 2018-10-25 IMAGING — CR DG CHEST 1V PORT
1 series · 1 of 1 positions shown · non-contrast
Comparison: Chest x-rays dated 11/05/2016 and 09/02/2016.

CLINICAL DATA: Acute resp fail

EXAM:
PORTABLE CHEST 1 VIEW

[AP]
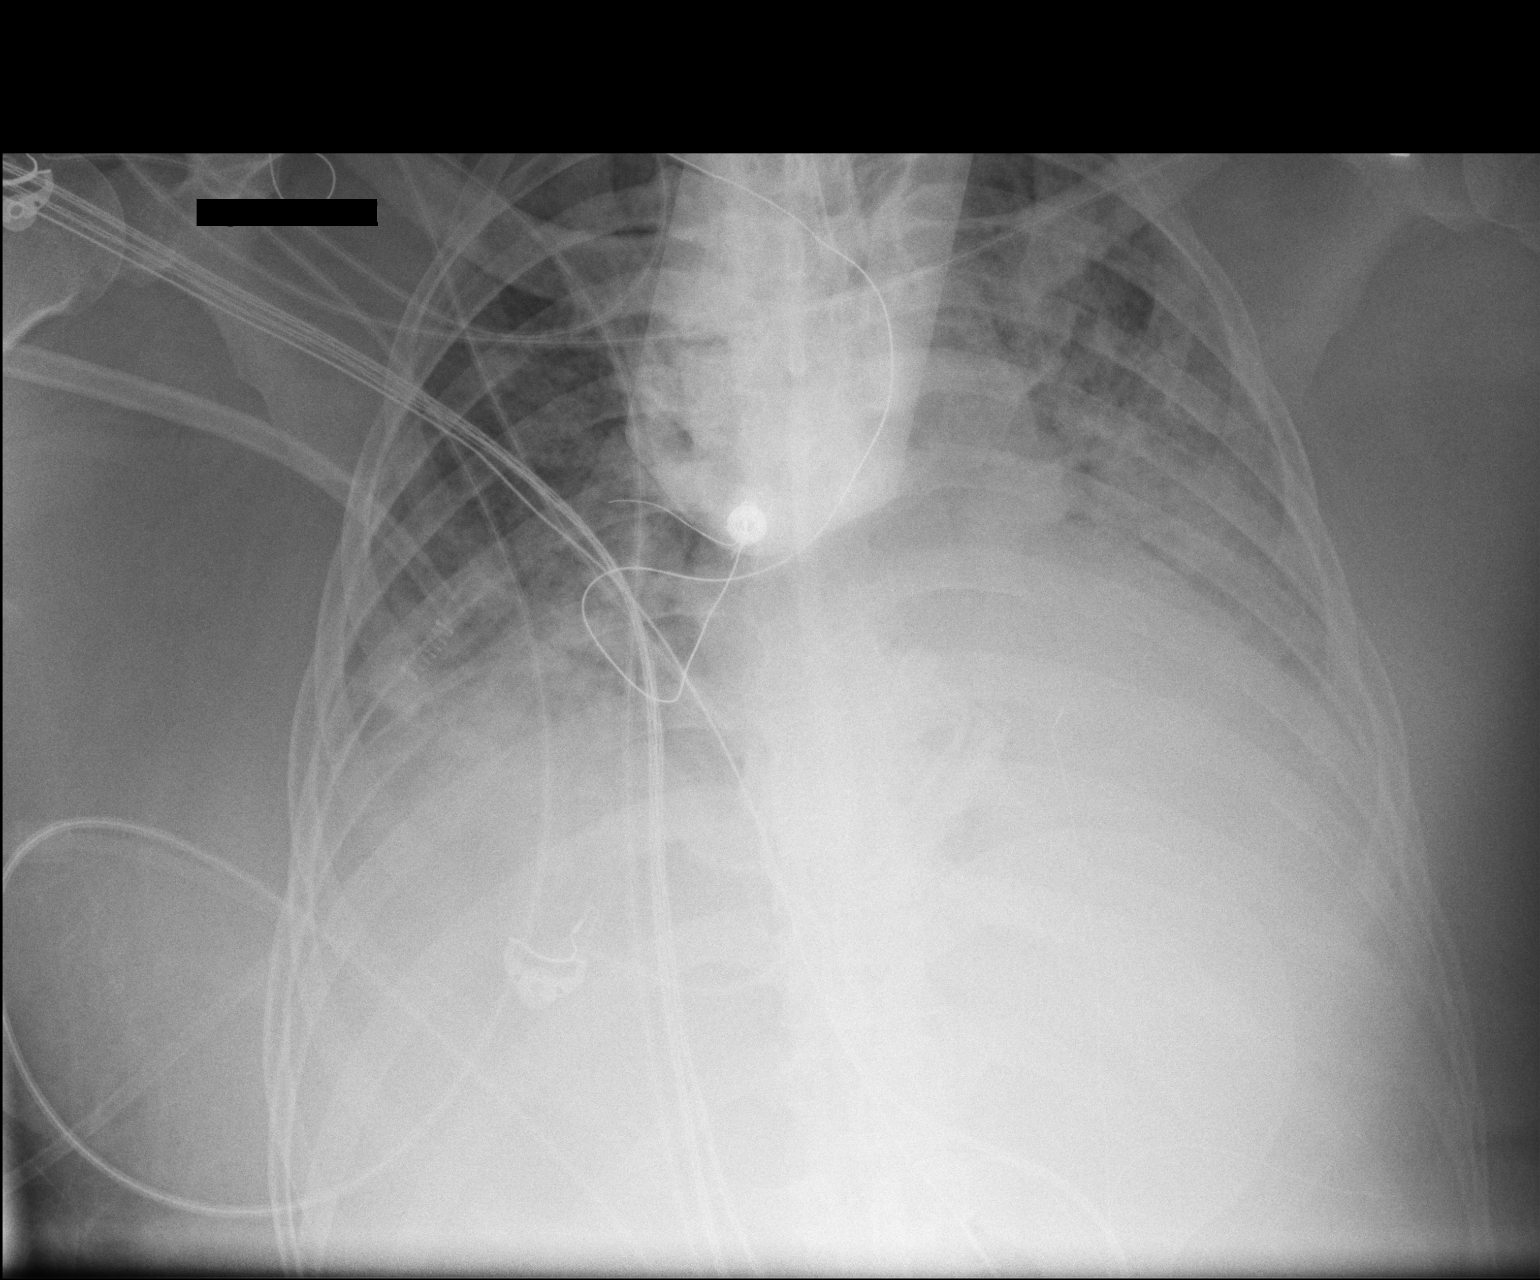

[1 of 1 positions shown; findings below may reference images not displayed]

FINDINGS: Cardiomediastinal silhouette is grossly stable in size and
configuration. Tracheostomy remains in the midline.

Persistent bilateral airspace opacities, slightly worsened on the
left and at the right lung base compared to yesterday's exam. No
pneumothorax seen.
IMPRESSION: Worsening airspace opacities bilaterally, pulmonary edema versus
pneumonia, favor pulmonary edema.
